# Patient Record
Sex: Female | Born: 1985 | Hispanic: Yes | Marital: Married | Smoking: Never smoker
Health system: Southern US, Community
[De-identification: ages and names within clinical notes are randomized; demographics above are authoritative.]

## PROBLEM LIST (undated history)

## (undated) DIAGNOSIS — Z789 Other specified health status: Secondary | ICD-10-CM

## (undated) DIAGNOSIS — R002 Palpitations: Secondary | ICD-10-CM

---

## 2012-01-26 ENCOUNTER — Emergency Department (HOSPITAL_COMMUNITY)
Admission: EM | Admit: 2012-01-26 | Discharge: 2012-01-27 | Disposition: A | Discharge status: Home / Self Care | Payer: Medicaid Other | Attending: Emergency Medicine | Admitting: Emergency Medicine

## 2012-01-26 ENCOUNTER — Encounter (HOSPITAL_COMMUNITY): Payer: Self-pay

## 2012-01-26 DIAGNOSIS — O99891 Other specified diseases and conditions complicating pregnancy: Secondary | ICD-10-CM | POA: Insufficient documentation

## 2012-01-26 DIAGNOSIS — K029 Dental caries, unspecified: Secondary | ICD-10-CM

## 2012-01-26 MED ORDER — HYDROCODONE-ACETAMINOPHEN 5-500 MG PO TABS: 1.0000 | ORAL_TABLET | Freq: Four times a day (QID) | ORAL | Status: AC | PRN

## 2012-01-26 MED ORDER — PENICILLIN V POTASSIUM 250 MG PO TABS: 250.0000 mg | ORAL_TABLET | Freq: Four times a day (QID) | ORAL | Status: AC

## 2012-01-26 NOTE — ED Provider Notes (Addendum)
History     CSN: [id number]  Arrival date & time 01/26/12  2136   First MD Initiated Contact with Patient 01/26/12 2311      Chief Complaint  Patient presents with  . Dental Pain    (Consider location/radiation/quality/duration/timing/severity/associated sxs/prior treatment) Patient is a 26 y.o. female presenting with tooth pain. The history is provided by the patient and the spouse. The history is limited by a language barrier.  Dental PainThe primary symptoms include mouth pain. Primary symptoms do not include fever. The symptoms began yesterday. The symptoms are worsening. The symptoms are recurrent. The symptoms occur constantly.  Additional symptoms include: dental sensitivity to temperature, gum swelling, gum tenderness, jaw pain, facial swelling and ear pain. Additional symptoms do not include: trouble swallowing, pain with swallowing and drooling. Medical issues include: periodontal disease.    History reviewed. No pertinent past medical history.  History reviewed. No pertinent past surgical history.  History reviewed. No pertinent family history.  History  Substance Use Topics  . Smoking status: Never Smoker   . Smokeless tobacco: Not on file  . Alcohol Use: No    OB History    Grav Para Term Preterm Abortions TAB SAB Ect Mult Living   1               Review of Systems  Constitutional: Negative for fever.  HENT: Positive for ear pain and facial swelling. Negative for drooling and trouble swallowing.   All other systems reviewed and are negative.    Allergies  Review of patient's allergies indicates not on file.  Home Medications  No current outpatient prescriptions on file.  BP 116/57  Pulse 76  Temp 98.1 F (36.7 C) (Oral)  Resp 12  SpO2 97%  LMP 11/06/2011  Physical Exam  Nursing note and vitals reviewed. Constitutional: She is oriented to person, place, and time. She appears well-developed and well-nourished. No distress.  HENT:  Head:  Normocephalic and atraumatic.    Mouth/Throat: Mucous membranes are normal.    Eyes: EOM are normal. Pupils are equal, round, and reactive to light.  Cardiovascular: Normal rate.   Neurological: She is alert and oriented to person, place, and time.  Skin: Skin is warm and dry. No rash noted. No erythema.    ED Course  Dental Date/Time: 01/26/2012 11:28 PM Performed by: Gwyneth Sprout Authorized by: Gwyneth Sprout Consent: Verbal consent obtained. Consent given by: patient Patient understanding: patient states understanding of the procedure being performed Patient identity confirmed: verbally with patient Local anesthesia used: yes Anesthesia: local infiltration Local anesthetic: bupivacaine 0.5% without epinephrine Anesthetic total: 2 ml Patient sedated: no Patient tolerance: Patient tolerated the procedure well with no immediate complications. Comments: Complete resolution of pain   (including critical care time)  Labs Reviewed - No data to display No results found.   1. Dental caries       MDM   Pt with dental caries and facial swelling.  No signs of ludwig's angina or difficulty swallowing and no systemic symptoms. Will treat with PCN and have pt f/u with dentist.  Pt is [redacted]weeks pregnant however does not have any pregnancy related complaints.         Gwyneth Sprout, MD 01/26/12 1191  Gwyneth Sprout, MD 01/26/12 2329

## 2012-01-26 NOTE — ED Notes (Signed)
Patient is alert and oriented x4.  Patient has no complaints of pain.  Discharge instructions were explained to her and the spouse and they had no questions.

## 2012-01-26 NOTE — ED Notes (Addendum)
Pt. Reports dental pain on right side x 24 hrs. Pain radiates to ear and neck on the right side.  History of  Similar pain on same tooth 2 months ago.  Took half a capsule of acetaminophen  at 1130am with no relief. Pt is [redacted] weeks pregnant. First ultrasound is scheduled for this coming Wed.

## 2012-02-10 ENCOUNTER — Other Ambulatory Visit (HOSPITAL_COMMUNITY)
Admission: RE | Admit: 2012-02-10 | Discharge: 2012-02-10 | Disposition: A | Discharge status: Home / Self Care | Payer: Medicaid Other | Source: Ambulatory Visit | Attending: Obstetrics and Gynecology | Admitting: Obstetrics and Gynecology

## 2012-02-10 DIAGNOSIS — Z01419 Encounter for gynecological examination (general) (routine) without abnormal findings: Secondary | ICD-10-CM | POA: Insufficient documentation

## 2012-02-10 DIAGNOSIS — Z113 Encounter for screening for infections with a predominantly sexual mode of transmission: Secondary | ICD-10-CM | POA: Insufficient documentation

## 2012-02-11 ENCOUNTER — Other Ambulatory Visit: Payer: Self-pay | Admitting: Obstetrics and Gynecology

## 2012-02-11 ENCOUNTER — Encounter (HOSPITAL_COMMUNITY): Payer: Self-pay

## 2012-02-11 ENCOUNTER — Encounter (HOSPITAL_COMMUNITY): Payer: Self-pay | Admitting: Pharmacy Technician

## 2012-02-11 ENCOUNTER — Encounter (HOSPITAL_COMMUNITY)
Admission: RE | Admit: 2012-02-11 | Discharge: 2012-02-11 | Disposition: A | Discharge status: Home / Self Care | Payer: Medicaid Other | Source: Ambulatory Visit | Attending: Obstetrics and Gynecology | Admitting: Obstetrics and Gynecology

## 2012-02-11 HISTORY — DX: Other specified health status: Z78.9

## 2012-02-11 LAB — SURGICAL PCR SCREEN
MRSA, PCR: NEGATIVE
Staphylococcus aureus: NEGATIVE

## 2012-02-11 NOTE — H&P (Signed)
Nicole Le is an 26 y.o. female. . She is admitted at this time for suction dilation and curettage due to missed abortion. She has been followed at [facility] ultrasound 01/29/2012 showing fetal pole and fetal heart motion present followup exam 02/10/2012 she had ultrasound confirmation of missed abortion with minimal fetal growth after her initial visit. Crown-rump length was 15.8 mm no fetal cardiac activity. She returned to 1 day later 02/11/2012 complaining cramping and after discussion desires section dilation and curettage. Treatment alternatives including her explanation of Cytotec induction of spontaneous abortion have been discussed with patient and she declines the suction decline blood type is O+ hemoglobin 12.9 hematocrit 37 hepatitis HIV RPR GC and Chlamydia are negative cysts negative screens negative. Past medical history positive for abnormal Pap view for repeat that at this time surgical history negative allergies none gravida 4 para 3003, term pregnancies delivered vaginally x3  Pertinent Gynecological History: Menses: LMP 11/06/2011 placing EDC 08/12/2012. Crown-rump does not correlate and gestational age based on early ultrasound suggested [facility] 09/10/2012 Bleeding: None at present Contraception: Undecided DES exposure: unknown Blood transfusions: none Sexually transmitted diseases: no past history Previous GYN Procedures: None  Last mammogram: Applicable Date:  Last pap:  Date:  OB History: G4, P3003   Menstrual History: Menarche age:  Patient's last menstrual period was 11/06/2011.    Past Medical History  Diagnosis Date  . No pertinent past medical history     Past Surgical History  Procedure Date  . No past surgeries     No family history on file.  Social History:  reports that she has never smoked. She does not have any smokeless tobacco history on file. She reports that she does not drink alcohol or use illicit drugs.  Allergies: No  Known Allergies  No prescriptions prior to admission    ROS  Last menstrual period 11/06/2011. Physical Exam Physical Examination: General appearance - alert, well appearing, and in no distress and she understands [place], but prefers to use her husband as Nurse, learning disability Mental status - alert, oriented to person, place, and time Chest - clear to auscultation, no wheezes, rales or rhonchi, symmetric air entry Heart - normal rate and regular rhythm Abdomen - soft, nontender, nondistended, no masses or organomegaly Pelvic - normal external genitalia, vulva, vagina, cervix, uterus and adnexa, UTERUS: uterus is normal size, shape, consistency and nontender, enlarged to 8 weeks week's size, ultrasound confirms missed abortion   Results for orders placed during the hospital encounter of 02/11/12 (from the past 24 hour(s))  SURGICAL PCR SCREEN     Status: Normal   Collection Time   02/11/12  1:00 PM      Component Value Range   MRSA, PCR NEGATIVE  NEGATIVE   Staphylococcus aureus NEGATIVE  NEGATIVE    No results found.  Assessment/Plan: Missed abortion [redacted] weeks gestation for suction dilation and curettage 7:30 AM on Thursday, 02/13/2012  Nicole Le V 02/11/2012, 8:39 PM

## 2012-02-11 NOTE — Patient Instructions (Addendum)
Your procedure is scheduled on:  02/13/2012  Report to [facility] at 6:15      AM.  Call this number if you have problems the morning of surgery: [phone]   Remember:   Do not drink or eat food:After Midnight.         Do not wear jewelry, make-up or nail polish.  Do not wear lotions, powders, or perfumes. You may wear deodorant.  Do not shave 48 hours prior to surgery.  Do not bring valuables to the hospital.  Contacts, dentures or bridgework may not be worn into surgery.  Leave suitcase in the car. After surgery it may be brought to your room.  For patients admitted to the hospital, checkout time is 11:00 AM the day of discharge.   Patients discharged the day of surgery will not be allowed to drive home.  Name and phone number of your driver:   Special Instructions: CHG Shower use special wash: 1/2 bottle night before surgery and 1/2 bottle morning of surgery.   Please read over the following fact sheets that you were given: Pain Booklet, MRSA  Surgical Site Infection Prevention, Anesthesia Post-op Instructions and Care and Recovery After Surgery    Dilatacin y curetaje o curetaje por aspiracin  (Dilation and Curettage or Vacuum Curettage) La dilatacin y curetaje y el curetaje por aspiracin son procedimientos menores. Consiste en la apertura (dilatacin) del cuello del tero y el raspado (curetaje) en la superficie interna del tero. Durante este procedimiento, el tejido del interior del tero se raspa suavemente. Durante el curetaje por aspiracin, se utiliza una succin suave para extirpar tejidos del tero.  Puede indicarse para realizar un diagnstico o tener un propsito teraputico. Como procedimiento diagnstico, se realiza para examinar los tejidos del tero. El examen del tejido puede determinar las causas o las opciones de tratamiento para diversos sntomas. Un diagnstico con curetaje se realiza en caso de presentarse los siguientes sntomas:   Sangrado irregular del  tero.   Hemorragias con cogulos.   Pierde sangre entre los perodos [employer].   Perodos menstruales prolongados.   Sangrado luego de la menopausia.   Falta de periodo menstrual (amenorrea).   Cambio en el tamao y forma del tero.  Un curetaje teraputico se realiza para extirpar tejidos, sangre, o un dispositivo anticonceptivo. El curetaje teraputico se realiza en caso de presentarse las siguientes enfermedades:   Remocin del DIU (dispositivo intrauterino).   Remocin de placenta retenida luego del parto . La placenta retenida puede causar una hemorragia lo suficientemente grave como para requerir transfusiones o Chiropractor una infeccin.   Cesacin del embarazo.   Aborto espontneo.   Extirpacin de plipos en el interior del tero.   Extirpacin de fibromas no frecuentes (bultos no cancerosos).  HGALE SABER A SU MDICO SOBRE:  Alergias a alimentos o medicamentos.   Medicamentos que [place], incluyendo vitaminas, hierbas, gotas oftlmicas, medicamentos de venta libre y cremas.   Uso de corticoides (por va oral o cremas).   Problemas anteriores debido a anestsicos o a medicamentos que [employer] sensibilidad.   Antecedentes de hemorragias o cogulos sanguneos.   Cirugas anteriores.   Otros problemas de salud, incluyendo diabetes y problemas renales.   Posibilidad de embarazo, si corresponde.  RIESGOS Y COMPLICACIONES  Hemorragia excesiva.   Infeccin en el tero.   Lesin en el cuello del tero.   Desarrollo de tejido Designer, television/film set (adherencias) dentro del tero que posteriormente causan hemorragia menstrual en cantidad anormal.   Complicaciones de la anestesia  general, si se ha usado.   Perforacin del tero. Pero es poco comn.  ANTES DEL PROCEDIMIENTO  Coma y beba antes del procedimiento slo lo que le indique el mdico.   Pdale a alguna persona que la lleve a su casa.  PROCEDIMIENTO  Este procedimiento puede ser [employer] un  hospital, como paciente ambulatorio o en el consultorio del mdico.   Podrn administrarle una anestesia general o local, alrededor del cuello del tero.   Deber recostarse sobre la espalda con las piernas en los estribos.   [employer] formas en las que el cuello del tero puede ser ablandado dilatado. Ellas son:   Tomando medicamentos.   Insertado unas varillas delgadas (laminarias) en el cuello del tero.   Con un instrumento curvo (cureta) rasparn las clulas de la superficie interna del tero, las que luego sern retiradas.  El procedimiento demora entre 15 y 30 minutos.  DESPUS DEL PROCEDIMIENTO  Descansar en una sala de recuperacin hasta que se sienta estable y lista para volver a su casa.   Tendr que solicitarle a alguna persona que la lleve a su casa.   Podr tener nuseas o vmitos si le han administrado anestesia general.   Financial risk analyst de garganta si le han colocado un tubo durante la anestesia general.   Podr sentir clicos leves tener una pequea hemorragia entre 2 das y 2 semanas despus del procedimiento.   Luego del procedimiento, el tero formar [place]. Esto podr retrasar su perodo menstrual.  Document Released: 11/27/2007 Document Revised: 05/30/2011 [facility] Patient Information 2012 [place], [place].

## 2012-02-13 ENCOUNTER — Encounter (HOSPITAL_COMMUNITY)
Admission: RE | Disposition: A | Discharge status: Home / Self Care | Payer: Self-pay | Source: Ambulatory Visit | Attending: Obstetrics and Gynecology

## 2012-02-13 ENCOUNTER — Encounter (HOSPITAL_COMMUNITY): Payer: Self-pay | Admitting: Anesthesiology

## 2012-02-13 ENCOUNTER — Ambulatory Visit (HOSPITAL_COMMUNITY)
Admission: RE | Admit: 2012-02-13 | Discharge: 2012-02-13 | Disposition: A | Discharge status: Home / Self Care | Payer: Medicaid Other | Source: Ambulatory Visit | Attending: Obstetrics and Gynecology | Admitting: Obstetrics and Gynecology

## 2012-02-13 ENCOUNTER — Ambulatory Visit (HOSPITAL_COMMUNITY): Payer: Medicaid Other | Admitting: Anesthesiology

## 2012-02-13 ENCOUNTER — Encounter (HOSPITAL_COMMUNITY): Payer: Self-pay | Admitting: *Deleted

## 2012-02-13 DIAGNOSIS — O021 Missed abortion: Secondary | ICD-10-CM | POA: Diagnosis present

## 2012-02-13 HISTORY — PX: DILATION AND CURETTAGE OF UTERUS: SHX78

## 2012-02-13 SURGERY — DILATION AND CURETTAGE
Anesthesia: General | Site: Vagina | Wound class: Clean Contaminated

## 2012-02-13 MED ORDER — PROPOFOL 10 MG/ML IV BOLUS
INTRAVENOUS | Status: DC | PRN
  Administered 2012-02-13: 130 mg via INTRAVENOUS

## 2012-02-13 MED ORDER — FENTANYL CITRATE 0.05 MG/ML IJ SOLN: 25.0000 ug | INTRAMUSCULAR | Status: DC | PRN

## 2012-02-13 MED ORDER — PROPOFOL 10 MG/ML IV EMUL
INTRAVENOUS | Status: AC
  Filled 2012-02-13: qty 20

## 2012-02-13 MED ORDER — FENTANYL CITRATE 0.05 MG/ML IJ SOLN
INTRAMUSCULAR | Status: DC | PRN
  Administered 2012-02-13 (×2): 50 ug via INTRAVENOUS

## 2012-02-13 MED ORDER — LIDOCAINE HCL 1 % IJ SOLN
INTRAMUSCULAR | Status: DC | PRN
  Administered 2012-02-13: 50 mg via INTRADERMAL

## 2012-02-13 MED ORDER — MIDAZOLAM HCL 2 MG/2ML IJ SOLN
1.0000 mg | INTRAMUSCULAR | Status: DC | PRN
  Administered 2012-02-13: 2 mg via INTRAVENOUS

## 2012-02-13 MED ORDER — CEFAZOLIN SODIUM-DEXTROSE 2-3 GM-% IV SOLR
INTRAVENOUS | Status: AC
  Filled 2012-02-13: qty 50

## 2012-02-13 MED ORDER — CEFAZOLIN SODIUM-DEXTROSE 2-3 GM-% IV SOLR: 2.0000 g | Freq: Once | INTRAVENOUS | Status: DC

## 2012-02-13 MED ORDER — ONDANSETRON HCL 4 MG/2ML IJ SOLN
4.0000 mg | Freq: Once | INTRAMUSCULAR | Status: AC
  Administered 2012-02-13: 4 mg via INTRAVENOUS

## 2012-02-13 MED ORDER — CEFAZOLIN SODIUM-DEXTROSE 2-3 GM-% IV SOLR
INTRAVENOUS | Status: DC | PRN
  Administered 2012-02-13: 2 g via INTRAVENOUS

## 2012-02-13 MED ORDER — ONDANSETRON HCL 4 MG/2ML IJ SOLN
INTRAMUSCULAR | Status: AC
  Filled 2012-02-13: qty 2

## 2012-02-13 MED ORDER — MIDAZOLAM HCL 2 MG/2ML IJ SOLN
INTRAMUSCULAR | Status: AC
  Filled 2012-02-13: qty 2

## 2012-02-13 MED ORDER — LACTATED RINGERS IV SOLN
INTRAVENOUS | Status: DC
  Administered 2012-02-13: 1000 mL via INTRAVENOUS

## 2012-02-13 MED ORDER — FENTANYL CITRATE 0.05 MG/ML IJ SOLN
INTRAMUSCULAR | Status: AC
  Filled 2012-02-13: qty 2

## 2012-02-13 MED ORDER — IBUPROFEN 200 MG PO TABS: 600.0000 mg | ORAL_TABLET | Freq: Four times a day (QID) | ORAL | Status: AC | PRN

## 2012-02-13 MED ORDER — BUPIVACAINE HCL (PF) 0.5 % IJ SOLN
INTRAMUSCULAR | Status: AC
  Filled 2012-02-13: qty 30

## 2012-02-13 MED ORDER — LIDOCAINE HCL (PF) 1 % IJ SOLN
INTRAMUSCULAR | Status: AC
  Filled 2012-02-13: qty 5

## 2012-02-13 MED ORDER — BUPIVACAINE HCL (PF) 0.5 % IJ SOLN
INTRAMUSCULAR | Status: DC | PRN
  Administered 2012-02-13: 20 mL

## 2012-02-13 MED ORDER — 0.9 % SODIUM CHLORIDE (POUR BTL) OPTIME
TOPICAL | Status: DC | PRN
  Administered 2012-02-13: 1000 mL

## 2012-02-13 MED ORDER — LACTATED RINGERS IV SOLN: INTRAVENOUS | Status: DC

## 2012-02-13 MED ORDER — MIDAZOLAM HCL 5 MG/5ML IJ SOLN
INTRAMUSCULAR | Status: DC | PRN
  Administered 2012-02-13: 2 mg via INTRAVENOUS

## 2012-02-13 MED ORDER — ONDANSETRON HCL 4 MG/2ML IJ SOLN: 4.0000 mg | Freq: Once | INTRAMUSCULAR | Status: DC | PRN

## 2012-02-13 SURGICAL SUPPLY — 35 items
BAG HAMPER (MISCELLANEOUS) ×2 IMPLANT
CATH ROBINSON RED A/P 14FR (CATHETERS) IMPLANT
CLOTH BEACON ORANGE TIMEOUT ST (SAFETY) ×2 IMPLANT
COVER LIGHT HANDLE STERIS (MISCELLANEOUS) ×4 IMPLANT
CURETTE VACUUM 9MM CVD CLR (CANNULA) ×2 IMPLANT
DECANTER SPIKE VIAL GLASS SM (MISCELLANEOUS) ×2 IMPLANT
FORMALIN 10 PREFIL 480ML (MISCELLANEOUS) ×2 IMPLANT
GAUZE SPONGE 4X4 16PLY XRAY LF (GAUZE/BANDAGES/DRESSINGS) ×2 IMPLANT
GLOVE BIOGEL PI IND STRL 7.0 (GLOVE) ×1 IMPLANT
GLOVE BIOGEL PI INDICATOR 7.0 (GLOVE) ×1
GLOVE ECLIPSE 9.0 STRL (GLOVE) ×2 IMPLANT
GLOVE EXAM NITRILE MD LF STRL (GLOVE) ×2 IMPLANT
GLOVE INDICATOR STER SZ 9 (GLOVE) ×2 IMPLANT
GLOVE SS BIOGEL STRL SZ 6.5 (GLOVE) ×1 IMPLANT
GLOVE SS BIOGEL STRL SZ 7.5 (GLOVE) ×1 IMPLANT
GLOVE SUPERSENSE BIOGEL SZ 6.5 (GLOVE) ×1
GLOVE SUPERSENSE BIOGEL SZ 7.5 (GLOVE) ×1
GOWN STRL REIN 3XL LVL4 (GOWN DISPOSABLE) ×2 IMPLANT
GOWN STRL REIN XL XLG (GOWN DISPOSABLE) ×4 IMPLANT
KIT BERKELEY 1ST TRIMESTER 3/8 (MISCELLANEOUS) ×2 IMPLANT
KIT ROOM TURNOVER AP CYSTO (KITS) ×2 IMPLANT
MARKER SKIN DUAL TIP RULER LAB (MISCELLANEOUS) ×2 IMPLANT
NEEDLE HYPO 18GX1.5 BLUNT FILL (NEEDLE) ×2 IMPLANT
NEEDLE SPNL 22GX3.5 QUINCKE BK (NEEDLE) ×2 IMPLANT
NS IRRIG 1000ML POUR BTL (IV SOLUTION) ×2 IMPLANT
PACK BASIC III (CUSTOM PROCEDURE TRAY) ×1
PACK SRG BSC III STRL LF ECLPS (CUSTOM PROCEDURE TRAY) ×1 IMPLANT
PAD ARMBOARD 7.5X6 YLW CONV (MISCELLANEOUS) ×2 IMPLANT
SET BERKELEY SUCTION TUBING (SUCTIONS) ×2 IMPLANT
SYR CONTROL 10ML LL (SYRINGE) ×2 IMPLANT
TOWEL OR 17X26 4PK STRL BLUE (TOWEL DISPOSABLE) ×2 IMPLANT
VACURETTE 10MM (CANNULA) IMPLANT
VACURETTE 12MM (CANNULA) IMPLANT
VACURETTE 14MM (CANNULA) IMPLANT
VACURETTE 8MM (CANNULA) IMPLANT

## 2012-02-13 NOTE — Transfer of Care (Signed)
Immediate Anesthesia Transfer of Care Note  Patient: Nicole Le  Procedure(s) Performed: Procedure(s) (LRB): DILATATION AND CURETTAGE (N/A)  Patient Location: PACU  Anesthesia Type: General  Level of Consciousness: awake and patient cooperative  Airway & Oxygen Therapy: Patient Spontanous Breathing and Patient connected to face mask oxygen  Post-op Assessment: Report given to PACU RN, Post -op Vital signs reviewed and stable and Patient moving all extremities  Post vital signs: Reviewed and stable  Complications: No apparent anesthesia complications

## 2012-02-13 NOTE — Anesthesia Postprocedure Evaluation (Signed)
  Anesthesia Post-op Note  Patient: Nicole Le  Procedure(s) Performed: Procedure(s) (LRB): DILATATION AND CURETTAGE (N/A)  Patient Location: PACU  Anesthesia Type: General  Level of Consciousness: awake, alert , oriented and patient cooperative  Airway and Oxygen Therapy: Patient Spontanous Breathing  Post-op Pain: none  Post-op Assessment: Post-op Vital signs reviewed, Patient's Cardiovascular Status Stable, Respiratory Function Stable, Patent Airway, No signs of Nausea or vomiting and Pain level controlled  Post-op Vital Signs: Reviewed and stable  Complications: No apparent anesthesia complications

## 2012-02-13 NOTE — Op Note (Signed)
See operative details included in the brief operative note 

## 2012-02-13 NOTE — Interval H&P Note (Signed)
History and Physical Interval Note:  02/13/2012 7:42 AM  Nicole Le  has presented today for surgery, with the diagnosis of missed abortion  The various methods of treatment have been discussed with the patient and family. After consideration of risks, benefits and other options for treatment, the patient has consented to  Procedure(s) (LRB): DILATATION AND CURETTAGE (N/A) as a surgical intervention .  The patient's history has been reviewed, patient examined, no change in status, stable for surgery.  I have reviewed the patient's chart and labs.  Questions were answered to the patient's satisfaction.     Tilda Burrow

## 2012-02-13 NOTE — Brief Op Note (Signed)
02/13/2012  8:27 AM  PATIENT:  Nicole Le  26 y.o. female  PRE-OPERATIVE DIAGNOSIS:  missed abortion  POST-OPERATIVE DIAGNOSIS:  missed abortion  PROCEDURE:  Procedure(s) (LRB): DILATATION AND CURETTAGE (N/A)  SURGEON:  Surgeon(s) and Role:    * Tilda Burrow, MD - Primary  PHYSICIAN ASSISTANT:   ASSISTANTS: none    ANESTHESIA:   local and general  EBL:  Total I/O In: 200 [I.V.:200] Out: -   BLOOD ADMINISTERED:none  DRAINS: none   LOCAL MEDICATIONS USED:  MARCAINE    and Amount: 20 ml  SPECIMEN:  Source of Specimen:  product of conception  DISPOSITION OF SPECIMEN:  PATHOLOGY  COUNTS:  YES  TOURNIQUET:  * No tourniquets in log *  DICTATION: .Dragon Dictation Patient was taken operating room where general anesthesia with LMA airway control.. Perineum was prepped and draped timeout conducted and Ancef administered. Speculum was inserted, cervix grasped with single-tooth tenaculum paracervical block applied using 20 cc of Marcaine with epinephrine. Uterus is sounded to 12 cm in the anteflexed position dilated to 31 [place] allowing introduction of curved 9 mm suction curette and circumferential curettage resulted in satisfactory removal of tissue products and fluid consistent with a missed AB. Smooth sharp curettage confirmed satisfactory uterine evacuation. There was no suspicion of complications or perforation. Minimal bleeding, and patient went to recovery in stable condition blood type confirmed as Rh+ from records in the office.  PLAN OF CARE: Discharge to home after PACU  PATIENT DISPOSITION:  PACU - hemodynamically stable.   Delay start of Pharmacological VTE agent (>24hrs) due to surgical blood loss or risk of bleeding: not applicable

## 2012-02-13 NOTE — Anesthesia Preprocedure Evaluation (Signed)
Anesthesia Evaluation  Patient identified by MRN, date of birth, ID band Patient awake    Reviewed: Allergy & Precautions, H&P , NPO status , Patient's Chart, lab work & pertinent test results  Airway Mallampati: II TM Distance: >3 FB Neck ROM: Full    Dental  (+) Teeth Intact   Pulmonary neg pulmonary ROS,  breath sounds clear to auscultation        Cardiovascular negative cardio ROS  Rhythm:Regular Rate:Normal     Neuro/Psych    GI/Hepatic negative GI ROS,   Endo/Other    Renal/GU      Musculoskeletal   Abdominal   Peds  Hematology   Anesthesia Other Findings   Reproductive/Obstetrics                           Anesthesia Physical Anesthesia Plan  ASA: I  Anesthesia Plan: General   Post-op Pain Management:    Induction: Intravenous  Airway Management Planned: LMA  Additional Equipment:   Intra-op Plan:   Post-operative Plan: Extubation in OR  Informed Consent: I have reviewed the patients History and Physical, chart, labs and discussed the procedure including the risks, benefits and alternatives for the proposed anesthesia with the patient or authorized representative who has indicated his/her understanding and acceptance.     Plan Discussed with:   Anesthesia Plan Comments:         Anesthesia Quick Evaluation  

## 2012-02-13 NOTE — Anesthesia Postprocedure Evaluation (Signed)
  Anesthesia Post-op Note  Patient: Makara Giusto  Procedure(s) Performed: Procedure(s) (LRB): DILATATION AND CURETTAGE (N/A)  Patient Location: PACU  Anesthesia Type: General  Level of Consciousness: awake and patient cooperative  Airway and Oxygen Therapy: Patient Spontanous Breathing and Patient connected to face mask oxygen  Post-op Pain: none  Post-op Assessment: Post-op Vital signs reviewed, Patient's Cardiovascular Status Stable, Respiratory Function Stable, Patent Airway, No signs of Nausea or vomiting and Pain level controlled  Post-op Vital Signs: Reviewed and stable  Complications: No apparent anesthesia complications

## 2012-02-13 NOTE — Anesthesia Procedure Notes (Signed)
Procedure Name: LMA Insertion Date/Time: 02/13/2012 8:00 AM Performed by: Despina Hidden Pre-anesthesia Checklist: Patient identified, Emergency Drugs available, Suction available and Patient being monitored Patient Re-evaluated:Patient Re-evaluated prior to inductionOxygen Delivery Method: Circle system utilized Preoxygenation: Pre-oxygenation with 100% oxygen Intubation Type: IV induction Ventilation: Mask ventilation without difficulty LMA Size: 3.0 Number of attempts: 1 Placement Confirmation: ETT inserted through vocal cords under direct vision,  positive ETCO2 and breath sounds checked- equal and bilateral Tube secured with: Tape Dental Injury: Teeth and Oropharynx as per pre-operative assessment

## 2012-02-17 ENCOUNTER — Encounter (HOSPITAL_COMMUNITY): Payer: Self-pay | Admitting: Obstetrics and Gynecology

## 2013-03-19 ENCOUNTER — Emergency Department (HOSPITAL_COMMUNITY): Payer: Medicaid Other

## 2013-03-19 ENCOUNTER — Emergency Department (HOSPITAL_COMMUNITY)
Admission: EM | Admit: 2013-03-19 | Discharge: 2013-03-19 | Disposition: A | Discharge status: Home / Self Care | Payer: Medicaid Other | Attending: Emergency Medicine | Admitting: Emergency Medicine

## 2013-03-19 ENCOUNTER — Encounter (HOSPITAL_COMMUNITY): Payer: Self-pay

## 2013-03-19 DIAGNOSIS — Z79899 Other long term (current) drug therapy: Secondary | ICD-10-CM | POA: Insufficient documentation

## 2013-03-19 DIAGNOSIS — R42 Dizziness and giddiness: Secondary | ICD-10-CM | POA: Insufficient documentation

## 2013-03-19 DIAGNOSIS — R0789 Other chest pain: Secondary | ICD-10-CM | POA: Insufficient documentation

## 2013-03-19 DIAGNOSIS — R0602 Shortness of breath: Secondary | ICD-10-CM | POA: Insufficient documentation

## 2013-03-19 DIAGNOSIS — Z3202 Encounter for pregnancy test, result negative: Secondary | ICD-10-CM | POA: Insufficient documentation

## 2013-03-19 LAB — URINALYSIS, ROUTINE W REFLEX MICROSCOPIC
Hgb urine dipstick: NEGATIVE
Leukocytes, UA: NEGATIVE
Nitrite: NEGATIVE
Specific Gravity, Urine: 1.01 (ref 1.005–1.030)
Urobilinogen, UA: 0.2 mg/dL (ref 0.0–1.0)

## 2013-03-19 LAB — COMPREHENSIVE METABOLIC PANEL
AST: 19 U/L (ref 0–37)
Albumin: 4.2 g/dL (ref 3.5–5.2)
Chloride: 102 mEq/L (ref 96–112)
Creatinine, Ser: 0.56 mg/dL (ref 0.50–1.10)
Total Bilirubin: 0.3 mg/dL (ref 0.3–1.2)
Total Protein: 7.7 g/dL (ref 6.0–8.3)

## 2013-03-19 LAB — CBC WITH DIFFERENTIAL/PLATELET
Basophils Absolute: 0 10*3/uL (ref 0.0–0.1)
Basophils Relative: 0 % (ref 0–1)
Eosinophils Absolute: 0 10*3/uL (ref 0.0–0.7)
MCHC: 34.2 g/dL (ref 30.0–36.0)
Monocytes Absolute: 0.5 10*3/uL (ref 0.1–1.0)
Neutro Abs: 5.4 10*3/uL (ref 1.7–7.7)
Neutrophils Relative %: 68 % (ref 43–77)
RDW: 12.5 % (ref 11.5–15.5)

## 2013-03-19 LAB — PREGNANCY, URINE: Preg Test, Ur: NEGATIVE

## 2013-03-19 LAB — D-DIMER, QUANTITATIVE: D-Dimer, Quant: <0.27 ug/mL-FEU (ref 0.00–0.48)

## 2013-03-19 MED ORDER — IBUPROFEN 800 MG PO TABS: 800.0000 mg | ORAL_TABLET | Freq: Three times a day (TID) | ORAL | Status: DC

## 2013-03-19 NOTE — ED Notes (Signed)
Pt reports cp and sob for 10 min pta, started after drinking a coke, no nausea or vomiting, no diarrhea or fever. Denies any cough or congestion. Had same episode 2 weeks ago and all symptoms went away

## 2013-03-19 NOTE — ED Provider Notes (Signed)
CSN: [id number]     Arrival date & time 03/19/13  1749 History  This chart was scribed for Glynn Octave, MD by Bennett Scrape, ED Scribe. This patient was seen in room APA06/APA06 and the patient's care was started at [facility] PM.   Chief Complaint  Patient presents with  . Chest Pain  . Shortness of Breath    The history is provided by the patient. A language interpreter was used Psychologist, prison and probation services Interpretators: [place], [id number]).   HPI Comments: Nicole Le is a 27 y.o. female who presents to the Emergency Department complaining of intermittent right-sided CP with associated SOB and dizziness for the past 2 weeks. She states that the pain is sharp, lasts approximately 30 minutes at a time and usually resolves on its own. The pain has been located on the right side with every episode and does not radiate. Most recent episode started 10 minutes PTA while sleeping and improved after drinking a soda. She states that "confinement" triggers the symptoms and that going outside to get "fresh air" improves her symptoms. She denies having a h/o anxiety. She denies having pain currently. She denies nausea, emesis, cough, abdominal pain, back pain, congestion, diarrhea and fevers as associated symptoms. LNMP was September 1st, 2014.  Pt denies having a PCP currently  Past Medical History  Diagnosis Date  . No pertinent past medical history    Past Surgical History  Procedure Laterality Date  . No past surgeries    . Dilation and curettage of uterus  02/13/2012    Procedure: DILATATION AND CURETTAGE;  Surgeon: Tilda Burrow, MD;  Location: [facility];  Service: Gynecology;  Laterality: N/A;  Suction    No family history on file. History  Substance Use Topics  . Smoking status: Never Smoker   . Smokeless tobacco: Not on file  . Alcohol Use: No   OB History   Grav Para Term Preterm Abortions TAB SAB Ect Mult Living   1              Review of Systems  A complete 10 system review of systems  was obtained and all systems are negative except as noted in the HPI and PMH.   Allergies  Review of patient's allergies indicates no known allergies.  Home Medications   Current Outpatient Rx  Name  Route  Sig  Dispense  Refill  . ibuprofen (ADVIL,MOTRIN) 800 MG tablet   Oral   Take 1 tablet (800 mg total) by mouth 3 (three) times daily.   21 tablet   0     Triage Vitals: BP 130/62  Pulse 76  Temp(Src) 97.7 F (36.5 C) (Oral)  Resp 20  Ht 5\' 2"  (1.575 m)  Wt 180 lb (81.647 kg)  BMI 32.91 kg/m2  SpO2 100%  LMP 02/22/2013  Breastfeeding? Unknown  Physical Exam  Nursing note and vitals reviewed. Constitutional: She is oriented to person, place, and time. She appears well-developed and well-nourished. No distress.  HENT:  Head: Normocephalic and atraumatic.  Eyes: Conjunctivae and EOM are normal.  Neck: Normal range of motion. Neck supple. No tracheal deviation present.  Cardiovascular: Normal rate, regular rhythm and normal heart sounds.   No murmur heard. Pulmonary/Chest: Effort normal and breath sounds normal. No respiratory distress. She has no wheezes. She has no rales. She exhibits tenderness (reproducible right-sided chest wall tenderness).  Abdominal: Soft. Bowel sounds are normal. There is no tenderness.  Musculoskeletal: Normal range of motion. She exhibits no edema (no calf swelling) and  no tenderness (no calf tenderness).  Neurological: She is alert and oriented to person, place, and time. No cranial nerve deficit.  Skin: Skin is warm and dry.  Psychiatric: She has a normal mood and affect. Her behavior is normal.    ED Course  Procedures (including critical care time)  DIAGNOSTIC STUDIES: Oxygen Saturation is 100% on room air, normal by my interpretation.    COORDINATION OF CARE: 6:20 PM-Advised pt that her symptoms are not cardiac related, most likely chest wall or anxiety related instead. Discussed treatment plan which includes CXR, CBC panel, CMP and  d-dimer with pt at bedside and pt agreed to plan. Pt is requesting for cholesterol check. Advised pt that she will need to have that checked by her PCP.  Labs Review Labs Reviewed  COMPREHENSIVE METABOLIC PANEL - Abnormal; Notable for the following:    Glucose, Bld 131 (*)    All other components within normal limits  CBC WITH DIFFERENTIAL  TROPONIN I  D-DIMER, QUANTITATIVE  PREGNANCY, URINE  URINALYSIS, ROUTINE W REFLEX MICROSCOPIC   Imaging Review Dg Chest 2 View  03/19/2013   CLINICAL DATA:  Chest pain and shortness of breath.  Fever.  EXAM: CHEST  2 VIEW  COMPARISON:  None.  FINDINGS: The heart size and mediastinal contours are within normal limits. Both lungs are clear. The visualized skeletal structures are unremarkable.  IMPRESSION: No active cardiopulmonary disease.   Electronically Signed   By: Amie Portland   On: 03/19/2013 19:50    MDM   1. Atypical chest pain    Intermittent episodes of chest pain and shortness of breath associated with anxious feeling. Episodes lasting for 10-30 minutes at a time improve with drinking Coke and going outside.  Symptoms atypical for ACS or PE. EKG nonischemic.  Chest x-ray negative. D-dimer and troponin negative. No recurrent chest pain or shortness of breath in the ED. Low suspicion for ACS or PE. We'll treat conservatively for muscle skeletal chest pain with possible component. Return precautions discussed.   Date: 03/19/2013  Rate: 83  Rhythm: normal sinus rhythm  QRS Axis: normal  Intervals: normal  ST/T Wave abnormalities: normal  Conduction Disutrbances:right bundle branch block  Narrative Interpretation:   Old EKG Reviewed: none available    I personally performed the services described in this documentation, which was scribed in my presence. The recorded information has been reviewed and is accurate.    Glynn Octave, MD 03/19/13 2023

## 2014-04-25 ENCOUNTER — Encounter (HOSPITAL_COMMUNITY): Payer: Self-pay

## 2014-05-03 ENCOUNTER — Other Ambulatory Visit (HOSPITAL_COMMUNITY)
Admission: RE | Admit: 2014-05-03 | Discharge: 2014-05-03 | Disposition: A | Discharge status: Home / Self Care | Payer: Medicaid Other | Source: Ambulatory Visit | Attending: Unknown Physician Specialty | Admitting: Unknown Physician Specialty

## 2014-05-03 DIAGNOSIS — R87610 Atypical squamous cells of undetermined significance on cytologic smear of cervix (ASC-US): Secondary | ICD-10-CM | POA: Insufficient documentation

## 2014-05-03 DIAGNOSIS — R87810 Cervical high risk human papillomavirus (HPV) DNA test positive: Secondary | ICD-10-CM | POA: Insufficient documentation

## 2014-05-10 LAB — CYTOLOGY - PAP

## 2016-08-06 ENCOUNTER — Other Ambulatory Visit (HOSPITAL_COMMUNITY)
Admission: RE | Admit: 2016-08-06 | Discharge: 2016-08-06 | Disposition: A | Discharge status: Home / Self Care | Payer: Medicaid Other | Source: Ambulatory Visit | Attending: Unknown Physician Specialty | Admitting: Unknown Physician Specialty

## 2016-08-06 DIAGNOSIS — N870 Mild cervical dysplasia: Secondary | ICD-10-CM | POA: Insufficient documentation

## 2016-08-06 DIAGNOSIS — Z9889 Other specified postprocedural states: Secondary | ICD-10-CM | POA: Insufficient documentation

## 2017-06-24 NOTE — L&D Delivery Note (Addendum)
Delivery Note At 12:14 PM, on 06/11/2018, a viable female "Daymon Larsen" was delivered via Vaginal, Spontaneous (Presentation: LOA with manual restitution to LOT ). After delivery of the head a shoulder dystocia was noted. McRober[place] and Suprapubic pressure was utilized with relief of the shoulder resulting in delivery of body ~30 seconds after delivery of head. Infant with good tone, but mild grimace that was immediately improved with tactile stimulation by provider.  Infant placed on mother's abdomen where nurse continued tactile stimulation. APGAR: 9, 9.  Cord clamped, cut, and blood collected. Placenta delivered spontaneously and noted to be intact with 3VC upon inspection. Vaginal inspection revealed a 2nd degree perineal laceration that was repaired with 3-0 vicryl on CT-1 And SH. ~62mL of lidocaine was utilized for local anesthetic and patient tolerated the procedure well. Fundus firm, at the umbilicus, and bleeding small.  Mother hemodynamically stable and infant skin to skin, with father, prior to provider exit.  Mother unsure of method for birth control and opts to breastfeed.  Infant weight at one hour of life: 7lbs 14.4oz    Anesthesia:  Lidocaine Episiotomy: None Lacerations: 2nd degree with repair Suture Repair: 3.0 vicryl Est. Blood Loss (mL): 47  Mom to postpartum.  Baby to Couplet care / Skin to Skin.  Maryann Conners MSN, CNM 06/11/2018, 12:55 PM

## 2017-10-29 ENCOUNTER — Other Ambulatory Visit: Payer: Self-pay | Admitting: Obstetrics & Gynecology

## 2017-10-29 DIAGNOSIS — O3680X0 Pregnancy with inconclusive fetal viability, not applicable or unspecified: Secondary | ICD-10-CM

## 2017-10-30 ENCOUNTER — Ambulatory Visit (INDEPENDENT_AMBULATORY_CARE_PROVIDER_SITE_OTHER): Payer: Self-pay

## 2017-10-30 ENCOUNTER — Encounter (INDEPENDENT_AMBULATORY_CARE_PROVIDER_SITE_OTHER): Payer: Self-pay

## 2017-10-30 DIAGNOSIS — O3680X0 Pregnancy with inconclusive fetal viability, not applicable or unspecified: Secondary | ICD-10-CM

## 2017-10-30 DIAGNOSIS — Z3A01 Less than 8 weeks gestation of pregnancy: Secondary | ICD-10-CM

## 2017-10-30 NOTE — Progress Notes (Signed)
[place] 6+1 wks,single IUP w/ys,positive fht 137 bpm,normal ovaries bilat,crl 4.18 mm

## 2017-11-21 ENCOUNTER — Encounter: Payer: Self-pay | Admitting: Women's Health

## 2017-11-21 ENCOUNTER — Ambulatory Visit: Payer: Self-pay | Admitting: *Deleted

## 2017-11-24 ENCOUNTER — Encounter: Payer: Self-pay | Admitting: Women's Health

## 2017-11-24 ENCOUNTER — Ambulatory Visit: Payer: Self-pay | Admitting: *Deleted

## 2017-11-24 ENCOUNTER — Ambulatory Visit (INDEPENDENT_AMBULATORY_CARE_PROVIDER_SITE_OTHER): Payer: Self-pay | Admitting: Women's Health

## 2017-11-24 VITALS — BP 110/72 | HR 80 | Wt 179.0 lb

## 2017-11-24 DIAGNOSIS — Z331 Pregnant state, incidental: Secondary | ICD-10-CM

## 2017-11-24 DIAGNOSIS — K5900 Constipation, unspecified: Secondary | ICD-10-CM

## 2017-11-24 DIAGNOSIS — Z1389 Encounter for screening for other disorder: Secondary | ICD-10-CM

## 2017-11-24 DIAGNOSIS — O9989 Other specified diseases and conditions complicating pregnancy, childbirth and the puerperium: Secondary | ICD-10-CM

## 2017-11-24 DIAGNOSIS — Z3682 Encounter for antenatal screening for nuchal translucency: Secondary | ICD-10-CM

## 2017-11-24 DIAGNOSIS — Z3490 Encounter for supervision of normal pregnancy, unspecified, unspecified trimester: Secondary | ICD-10-CM | POA: Insufficient documentation

## 2017-11-24 DIAGNOSIS — Z3481 Encounter for supervision of other normal pregnancy, first trimester: Secondary | ICD-10-CM

## 2017-11-24 DIAGNOSIS — Z3A09 9 weeks gestation of pregnancy: Secondary | ICD-10-CM

## 2017-11-24 DIAGNOSIS — O99611 Diseases of the digestive system complicating pregnancy, first trimester: Secondary | ICD-10-CM

## 2017-11-24 DIAGNOSIS — R110 Nausea: Secondary | ICD-10-CM

## 2017-11-24 LAB — POCT URINALYSIS DIPSTICK
Blood, UA: NEGATIVE
Glucose, UA: NEGATIVE
KETONES UA: NEGATIVE
Leukocytes, UA: NEGATIVE
Nitrite, UA: NEGATIVE
Protein, UA: NEGATIVE

## 2017-11-24 NOTE — Progress Notes (Signed)
INITIAL OBSTETRICAL VISIT Patient name: Nicole Le MRN [id number]  Date of birth: 10-May-1986 Chief Complaint:   Initial Prenatal Visit (constipation)  History of Present Illness:   Nicole Le is a 32 y.o. G37P0010 Hispanic female at [redacted]w[redacted]d by 6wk u/s, with an Estimated Date of Delivery: 06/24/18 being seen today for her initial obstetrical visit.   Her obstetrical history is significant for missed Ab w/ D&C.   Today she reports some constipation, mild nausea, pressure/cramping- no bleeding.  Patient's last menstrual period was 09/10/2017 (exact date). Last pap 08/2017 at [facility]. Results were: normal Review of Systems:   Pertinent items are noted in HPI Denies cramping/contractions, leakage of fluid, vaginal bleeding, abnormal vaginal discharge w/ itching/odor/irritation, headaches, visual changes, shortness of breath, chest pain, abdominal pain, severe nausea/vomiting, or problems with urination or bowel movements unless otherwise stated above.  Pertinent History Reviewed:  Reviewed past medical,surgical, social, obstetrical and family history.  Reviewed problem list, medications and allergies. OB History  Gravida Para Term Preterm AB Living  2       1    SAB TAB Ectopic Multiple Live Births  1            # Outcome Date GA Lbr Len/2nd Weight Sex Delivery Anes PTL Lv  2 Current           1 SAB 02/13/12 [redacted]w[redacted]d            Birth Comments: System Generated. Please review and update pregnancy details.   Physical Assessment:   Vitals:   11/24/17 1126  BP: 110/72  Pulse: 80  Weight: 179 lb (81.2 kg)  Body mass index is 32.74 kg/m.       Physical Examination:  General appearance - well appearing, and in no distress  Mental status - alert, oriented to person, place, and time  Psych:  She has a normal mood and affect  Skin - warm and dry, normal color, no suspicious lesions noted  Chest - effort normal, all lung fields clear to auscultation bilaterally  Heart -  normal rate and regular rhythm  Abdomen - soft, nontender  Extremities:  No swelling or varicosities noted  Thin prep pap is not done   Fetal Heart Rate (bpm): +u/s via informal transabdominal u/s  Results for orders placed or performed in visit on 11/24/17 (from the past 24 hour(s))  POCT urinalysis dipstick   Collection Time: 11/24/17 11:58 AM  Result Value Ref Range   Color, UA     Clarity, UA     Glucose, UA Negative Negative   Bilirubin, UA     Ketones, UA neg    Spec Grav, UA  1.010 - 1.025   Blood, UA neg    pH, UA  5.0 - 8.0   Protein, UA Negative Negative   Urobilinogen, UA  0.2 or 1.0 E.U./dL   Nitrite, UA neg    Leukocytes, UA Negative Negative   Appearance     Odor      Assessment & Plan:  1) Low-Risk Pregnancy G2P0010 at [redacted]w[redacted]d with an Estimated Date of Delivery: 06/24/18   2) Initial OB visit  3) Constipation> gave printed prevention/relief measures   4) Pressure/cramping> will send urine cx, discussed warning s/s, reasons to seek care  Meds: No orders of the defined types were placed in this encounter.   Initial labs obtained Continue prenatal vitamins Reviewed n/v relief measures and warning s/s to report Reviewed recommended weight gain based on pre-gravid BMI Encouraged  well-balanced diet Genetic Screening discussed Integrated Screen: requested Cystic fibrosis screening discussed requested Ultrasound discussed; fetal survey: requested [place] completed>spoke w/ [place] used for encounter  Follow-up: Return in about 3 weeks (around 12/17/2017) for LROB, US:NT+1stIT.   Orders Placed This Encounter  Procedures  . Urine Culture  . GC/Chlamydia Probe Amp  . US Fetal Nuchal Translucency Measurement  . Obstetric Panel, Including HIV  . Urinalysis, Routine w reflex microscopic  . Cystic Fibrosis Mutation 97  . Pain Management Screening Profile (10S)  . POCT urinalysis dipstick    Roma Schanz CNM, [facility] 11/24/2017 12:13 PM

## 2017-11-24 NOTE — Patient Instructions (Signed)
Nicole Le, I greatly value your feedback.  If you receive a survey following your visit with [place] today, we appreciate you taking the time to fill it out.  Thanks, Knute Neu, CNM, WHNP-BC   Nausea & Vomiting  Have saltine crackers or pretzels by your bed and eat a few bites before you raise your head out of bed in the morning  Eat small frequent meals throughout the day instead of large meals  Drink plenty of fluids throughout the day to stay hydrated, just don't drink a lot of fluids with your meals.  This can make your stomach fill up faster making you feel sick  Do not brush your teeth right after you eat  Products with real ginger are good for nausea, like ginger ale and ginger hard candy Make sure it says made with real ginger!  Sucking on sour candy like lemon heads is also good for nausea  If your prenatal vitamins make you nauseated, take them at night so you will sleep through the nausea  Sea Bands  If you feel like you need medicine for the nausea & vomiting please let us know  If you are unable to keep any fluids or food down please let us know   Constipation  Drink plenty of fluid, preferably water, throughout the day  Eat foods high in fiber such as fruits, vegetables, and grains  Exercise, such as walking, is a good way to keep your bowels regular  Drink warm fluids, especially warm prune juice, or decaf coffee  Eat a 1/2 cup of real oatmeal (not instant), 1/2 cup applesauce, and 1/2-1 cup warm prune juice every day  If needed, you may take Colace (docusate sodium) stool softener once or twice a day to help keep the stool soft. If you are pregnant, wait until you are out of your first trimester (12-14 weeks of pregnancy)  If you still are having problems with constipation, you may take Miralax once daily as needed to help keep your bowels regular.  If you are pregnant, wait until you are out of your first trimester (12-14 weeks of  pregnancy)   Primer trimestre de Media planner (First Trimester of Pregnancy) El primer trimestre de Media planner se extiende desde la semana1 hasta el final de la semana12 (mes1 al mes3). Durante este tiempo, el beb comenzar a desarrollarse dentro suyo. Entre la semana6 y [place], se forman los ojos y [place], y los latidos del corazn pueden verse en la ecografa. Al final de las 12semanas, todos los rganos del beb estn formados. La atencin prenatal es toda la asistencia mdica que usted recibe antes del nacimiento del beb. Asegrese de recibir una buena atencin prenatal y de seguir todas las indicaciones del mdico. CUIDADOS EN EL HOGAR Medicamentos:  Tome los medicamentos solamente como se lo haya indicado el mdico. Algunos medicamentos se pueden tomar durante el Media planner y otros no.  Tome las vitaminas prenatales como se lo haya indicado el [place] medicamento que la ayuda a Landscape architect (laxante [place]) segn sea necesario, si el mdico lo autoriza. Dieta  Ingiera alimentos saludables de [place] regular.  El Buyer, retail la cantidad de peso que [place].  No coma carne cruda ni quesos sin cocinar.  Si tiene Higher education careers adviser (nuseas) o vomita: ? Ingiera 4 o 5comidas pequeas por TEFL teacher de 3abundantes. ? Intente comer algunas galletitas saladas. ? Beba lquidos [employer], en lugar de [employer].  Si tiene dificultad  para defecar (estreimiento): ? Consuma alimentos con alto contenido de [place], como verduras y frutas frescos, y Psychologist, prison and probation services. ? Beba suficiente lquido para mantener el pis (orina) claro o de color amarillo plido. Actividad y ejercicios  Haga ejercicios solamente como se lo haya indicado el mdico. Deje de hacer ejercicios si tiene clicos o dolor en la parte baja del vientre (abdomen) o en la cintura.  Intente no estar de pie [employer]. Mueva las piernas con frecuencia si debe estar de pie en un  lugar durante mucho tiempo.  Evite levantar pesos [employer].  Use zapatos con tacones bajos. Mantenga una buena postura al sentarse y pararse.  Puede tener [employer], a menos que el mdico le indique lo contrario. Alivio del [place] molestias  Use un sostn que le brinde buen soporte si le duelen las [place].  Dese baos con agua tibia (baos de asiento) para Best boy o las molestias a causa de las hemorroides. Use crema antihemorroidal si el mdico se lo permite.  Descanse con las piernas elevadas si tiene calambres o dolor de cintura.  Use medias de descanso si tiene las venas de las piernas hinchadas y abultadas (venas varicosas). Eleve los pies durante 46minutos, 3 o 4veces por da. Limite la cantidad de sal en su dieta. Cuidados prenatales  Programe las visitas prenatales para la semana12 de [place].  Escriba sus preguntas. Llvelas cuando concurra a las visitas prenatales.  Concurra a todas las visitas prenatales como se lo haya indicado el mdico. Seguridad  Colquese el cinturn de seguridad cuando conduzca.  Haga una lista con los nmeros de telfono en caso de Freight forwarder, en la cual deben incluirse los nmeros de los familiares, los amigos, el hospital y los departamentos de polica y de bomberos. Consejos generales  [place] al mdico que la derive a clases prenatales en su localidad. Debe comenzar a tomar las clases antes de Dietitian en el mes6 de embarazo.  Pida ayuda si necesita asesoramiento o asistencia con la alimentacin. El mdico puede aconsejarla o indicarle dnde recurrir para recibir [place].  No se d baos de inmersin en agua caliente, baos turcos ni saunas.  No se haga duchas vaginales ni use tampones o toallas higinicas perfumadas.  No mantenga las piernas cruzadas durante mucho tiempo.  Evite el contacto con las bandejas sanitarias de los gatos y la tierra que estos animales usan.  No fume, no consuma hierbas ni beba alcohol.  No tome frmacos que el mdico no haya autorizado.  No consuma ningn producto que contenga tabaco, lo que incluye cigarrillos, tabaco de Higher education careers adviser o Psychologist, sport and exercise. Si necesita ayuda para dejar de fumar, consulte al [employer]. Puede recibir asesoramiento u otro tipo de apoyo para dejar de fumar.  Visite al dentista. En su casa, lvese los dientes con un cepillo dental suave. Psese el hilo dental con suavidad. SOLICITE AYUDA SI:  Tiene mareos.  Tiene clicos leves o siente presin en la parte baja del vientre.  Siente un dolor persistente en la zona del vientre.  Sigue teniendo [employer], vomita o las heces son lquidas (diarrea).  Observa una secrecin, con mal olor que proviene de la vagina.  Siente dolor al [employer].  Tiene el rostro, las [place], las piernas o los tobillos ms hinchados (inflamados).  SOLICITE AYUDA DE INMEDIATO SI:  Tiene fiebre.  Tiene una prdida de lquido por la vagina.  Tiene sangrado o pequeas prdidas vaginales.  Tiene clicos o dolor muy intensos en el vientre.  [place]  o baja de peso rpidamente.  Vomita sangre. Puede ser similar a la borra del caf  Est en contacto con personas que tienen rubola, la quinta enfermedad o varicela.  Siente un dolor de cabeza muy intenso.  Le falta el aire.  Sufre cualquier tipo de traumatismo, por ejemplo, debido a una cada o un accidente automovilstico.  Esta informacin no tiene Marine scientist el consejo del mdico. Asegrese de hacerle al mdico cualquier pregunta que tenga. Document Released: 09/06/2008 Document Revised: 07/01/2014 Document Reviewed: 04/20/2013 Elsevier Interactive Patient Education  2017 [employer].

## 2017-11-25 LAB — PMP SCREEN PROFILE (10S), URINE
Amphetamine Scrn, Ur: NEGATIVE ng/mL
BARBITURATE SCREEN URINE: NEGATIVE ng/mL
BENZODIAZEPINE SCREEN, URINE: NEGATIVE ng/mL
CANNABINOIDS UR QL SCN: NEGATIVE ng/mL
CREATININE(CRT), U: 56.1 mg/dL (ref 20.0–300.0)
Cocaine (Metab) Scrn, Ur: NEGATIVE ng/mL
Methadone Screen, Urine: NEGATIVE ng/mL
OPIATE SCREEN URINE: NEGATIVE ng/mL
OXYCODONE+OXYMORPHONE UR QL SCN: NEGATIVE ng/mL
Ph of Urine: 5.8 (ref 4.5–8.9)
Phencyclidine Qn, Ur: NEGATIVE ng/mL
Propoxyphene Scrn, Ur: NEGATIVE ng/mL

## 2017-11-25 LAB — MED LIST OPTION NOT SELECTED

## 2017-11-26 LAB — URINE CULTURE

## 2017-11-26 LAB — GC/CHLAMYDIA PROBE AMP
Chlamydia trachomatis, NAA: NEGATIVE
Neisseria gonorrhoeae by PCR: NEGATIVE

## 2017-12-02 LAB — URINALYSIS, ROUTINE W REFLEX MICROSCOPIC
BILIRUBIN UA: NEGATIVE
GLUCOSE, UA: NEGATIVE
KETONES UA: NEGATIVE
Leukocytes, UA: NEGATIVE
Nitrite, UA: NEGATIVE
PH UA: 6.5 (ref 5.0–7.5)
PROTEIN UA: NEGATIVE
RBC UA: NEGATIVE
Specific Gravity, UA: 1.01 (ref 1.005–1.030)
UUROB: 0.2 mg/dL (ref 0.2–1.0)

## 2017-12-02 LAB — OBSTETRIC PANEL, INCLUDING HIV
Antibody Screen: NEGATIVE
BASOS ABS: 0 10*3/uL (ref 0.0–0.2)
BASOS: 0 %
EOS (ABSOLUTE): 0.1 10*3/uL (ref 0.0–0.4)
EOS: 1 %
HEMATOCRIT: 40.8 % (ref 34.0–46.6)
HEMOGLOBIN: 13.3 g/dL (ref 11.1–15.9)
HIV Screen 4th Generation wRfx: NONREACTIVE
Hepatitis B Surface Ag: NEGATIVE
IMMATURE GRANULOCYTES: 0 %
Immature Grans (Abs): 0 10*3/uL (ref 0.0–0.1)
LYMPHS ABS: 2 10*3/uL (ref 0.7–3.1)
LYMPHS: 26 %
MCH: 30.2 pg (ref 26.6–33.0)
MCHC: 32.6 g/dL (ref 31.5–35.7)
MCV: 93 fL (ref 79–97)
Monocytes Absolute: 0.5 10*3/uL (ref 0.1–0.9)
Monocytes: 7 %
Neutrophils Absolute: 5.2 10*3/uL (ref 1.4–7.0)
Neutrophils: 66 %
Platelets: 244 10*3/uL (ref 150–450)
RBC: 4.4 x10E6/uL (ref 3.77–5.28)
RDW: 13.8 % (ref 12.3–15.4)
RPR Ser Ql: NONREACTIVE
Rh Factor: POSITIVE
Rubella Antibodies, IGG: 19.6 index (ref >0.99)
WBC: 7.8 10*3/uL (ref 3.4–10.8)

## 2017-12-02 LAB — CYSTIC FIBROSIS MUTATION 97: GENE DIS ANAL CARRIER INTERP BLD/T-IMP: NOT DETECTED

## 2017-12-19 ENCOUNTER — Ambulatory Visit (INDEPENDENT_AMBULATORY_CARE_PROVIDER_SITE_OTHER): Payer: Self-pay

## 2017-12-19 ENCOUNTER — Ambulatory Visit (INDEPENDENT_AMBULATORY_CARE_PROVIDER_SITE_OTHER): Payer: Self-pay | Admitting: Obstetrics and Gynecology

## 2017-12-19 ENCOUNTER — Encounter: Payer: Self-pay | Admitting: Obstetrics and Gynecology

## 2017-12-19 VITALS — BP 107/68 | HR 64 | Wt 170.2 lb

## 2017-12-19 DIAGNOSIS — Z3A13 13 weeks gestation of pregnancy: Secondary | ICD-10-CM

## 2017-12-19 DIAGNOSIS — Z3481 Encounter for supervision of other normal pregnancy, first trimester: Secondary | ICD-10-CM

## 2017-12-19 DIAGNOSIS — Z331 Pregnant state, incidental: Secondary | ICD-10-CM

## 2017-12-19 DIAGNOSIS — Z1389 Encounter for screening for other disorder: Secondary | ICD-10-CM

## 2017-12-19 DIAGNOSIS — Z3682 Encounter for antenatal screening for nuchal translucency: Secondary | ICD-10-CM

## 2017-12-19 LAB — POCT URINALYSIS DIPSTICK
GLUCOSE UA: NEGATIVE
Ketones, UA: NEGATIVE
Leukocytes, UA: NEGATIVE
Nitrite, UA: NEGATIVE
Protein, UA: NEGATIVE
RBC UA: NEGATIVE

## 2017-12-19 NOTE — Progress Notes (Signed)
Patient ID: Nicole Le, female   DOB: Oct 23, 1985, 32 y.o.   MRN: [id number]    LOW-RISK PREGNANCY VISIT Patient name: Nicole Le MRN [id number]  Date of birth: 07/14/1985 Chief Complaint:   Routine Prenatal Visit (1st IT)  History of Present Illness:   Nicole Le is a 32 y.o. G2P0010 female at [redacted]w[redacted]d with an Estimated Date of Delivery: 06/24/18 being seen today for ongoing management of a low-risk pregnancy.  Today she reports no complaints. She claims she can feel the baby moving and struggles to bend over sometimes.    . Vag. Bleeding: None.   . denies leaking of fluid. Review of Systems:   Pertinent items are noted in HPI Denies abnormal vaginal discharge w/ itching/odor/irritation, headaches, visual changes, shortness of breath, chest pain, abdominal pain, severe nausea/vomiting, or problems with urination or bowel movements unless otherwise stated above. Pertinent History Reviewed:  Reviewed past medical,surgical, social, obstetrical and family history.  Reviewed problem list, medications and allergies. Physical Assessment:   Vitals:   12/19/17 1151  BP: 107/68  Pulse: 64  Weight: 170 lb 3.2 oz (77.2 kg)  Body mass index is 31.13 kg/m.        Physical Examination:   General appearance: Well appearing, and in no distress  Mental status: Alert, oriented to person, place, and time  Skin: Warm & dry  Cardiovascular: Normal heart rate noted  Respiratory: Normal respiratory effort, no distress  Abdomen: Soft, gravid, nontender  Pelvic: Cervical exam deferred         Extremities: Edema: None  Fetal Status:          Results for orders placed or performed in visit on 12/19/17 (from the past 24 hour(s))  POCT urinalysis dipstick   Collection Time: 12/19/17 11:52 AM  Result Value Ref Range   Color, UA     Clarity, UA     Glucose, UA Negative Negative   Bilirubin, UA     Ketones, UA neg    Spec Grav, UA  1.010 - 1.025   Blood, UA neg     pH, UA  5.0 - 8.0   Protein, UA Negative Negative   Urobilinogen, UA  0.2 or 1.0 E.U./dL   Nitrite, UA neg    Leukocytes, UA Negative Negative   Appearance     Odor      Assessment & Plan:  1) Low-risk pregnancy G2P0010 at [redacted]w[redacted]d with an Estimated Date of Delivery: 06/24/18    Meds: No orders of the defined types were placed in this encounter.  Labs/procedures today: [place] 13+2 wks,measurements c/w dates,NB present,NT 1.4 mm,normal ovaries bilat,anterior pl gr 0 fhr 150 bpm 1st IT drawn  Plan:  Continue routine obstetrical care  Follow-up: Return in about 1 month (around 01/16/2018) for [place].  Orders Placed This Encounter  Procedures  . Integrated 1  . POCT urinalysis dipstick   By signing my name below, I, De Burrs, attest that this documentation has been prepared under the direction and in the presence of Jonnie Kind, MD. Electronically Signed: De Burrs, Medical Scribe. 12/19/17. 11:57 AM.  I personally performed the services described in this documentation, which was SCRIBED in my presence. The recorded information has been reviewed and considered accurate. It has been edited as necessary during review. Jonnie Kind, MD

## 2017-12-19 NOTE — Progress Notes (Signed)
[place] 13+2 wks,measurements c/w dates,NB present,NT 1.4 mm,normal ovaries bilat,anterior pl gr 0 fhr 150 bpm

## 2017-12-23 LAB — INTEGRATED 1
CROWN RUMP LENGTH MAT SCREEN: 71.8 mm
GEST. AGE ON COLLECTION DATE: 13.1 wk
MATERNAL AGE AT EDD: 35 a
NUMBER OF FETUSES: 1
Nuchal Translucency (NT): 1.4 mm
PAPP-A VALUE: 648.9 ng/mL
WEIGHT: 170 [lb_av]

## 2018-01-08 ENCOUNTER — Telehealth: Payer: Self-pay | Admitting: Women's Health

## 2018-01-08 MED ORDER — PRENATAL PLUS 27-1 MG PO TABS: 1.0000 | ORAL_TABLET | Freq: Every day | ORAL | 12 refills | Status: DC

## 2018-01-08 NOTE — Telephone Encounter (Signed)
Patient called stating that she needs a refill of her prenatal vitamins sent to her pharmacy @ [facility] in [place]. Please contact pt when sent

## 2018-01-14 ENCOUNTER — Emergency Department (HOSPITAL_COMMUNITY): Payer: Self-pay

## 2018-01-14 ENCOUNTER — Emergency Department (HOSPITAL_COMMUNITY)
Admission: EM | Admit: 2018-01-14 | Discharge: 2018-01-14 | Disposition: A | Discharge status: Home / Self Care | Payer: Self-pay | Attending: Emergency Medicine | Admitting: Emergency Medicine

## 2018-01-14 ENCOUNTER — Encounter (HOSPITAL_COMMUNITY): Payer: Self-pay | Admitting: *Deleted

## 2018-01-14 DIAGNOSIS — R1030 Lower abdominal pain, unspecified: Secondary | ICD-10-CM

## 2018-01-14 DIAGNOSIS — Z3A16 16 weeks gestation of pregnancy: Secondary | ICD-10-CM | POA: Insufficient documentation

## 2018-01-14 DIAGNOSIS — M545 Low back pain: Secondary | ICD-10-CM | POA: Insufficient documentation

## 2018-01-14 DIAGNOSIS — O26892 Other specified pregnancy related conditions, second trimester: Secondary | ICD-10-CM | POA: Insufficient documentation

## 2018-01-14 DIAGNOSIS — O9989 Other specified diseases and conditions complicating pregnancy, childbirth and the puerperium: Secondary | ICD-10-CM | POA: Insufficient documentation

## 2018-01-14 LAB — COMPREHENSIVE METABOLIC PANEL
ALT: 18 U/L (ref 0–44)
AST: 19 U/L (ref 15–41)
Albumin: 3.3 g/dL — ABNORMAL LOW (ref 3.5–5.0)
Alkaline Phosphatase: 45 U/L (ref 38–126)
Anion gap: 9 (ref 5–15)
BUN: 5 mg/dL — ABNORMAL LOW (ref 6–20)
CO2: 21 mmol/L — ABNORMAL LOW (ref 22–32)
Calcium: 8.9 mg/dL (ref 8.9–10.3)
Chloride: 106 mmol/L (ref 98–111)
Creatinine, Ser: 0.52 mg/dL (ref 0.44–1.00)
GFR calc Af Amer: >60 mL/min (ref >60)
GFR calc non Af Amer: >60 mL/min (ref >60)
Glucose, Bld: 83 mg/dL (ref 70–99)
Potassium: 3.7 mmol/L (ref 3.5–5.1)
Sodium: 136 mmol/L (ref 135–145)
Total Bilirubin: 0.4 mg/dL (ref 0.3–1.2)
Total Protein: 6.3 g/dL — ABNORMAL LOW (ref 6.5–8.1)

## 2018-01-14 LAB — URINALYSIS, ROUTINE W REFLEX MICROSCOPIC
Bilirubin Urine: NEGATIVE
Glucose, UA: NEGATIVE mg/dL
Hgb urine dipstick: NEGATIVE
Ketones, ur: 5 mg/dL — AB
Leukocytes, UA: NEGATIVE
Nitrite: NEGATIVE
Protein, ur: NEGATIVE mg/dL
Specific Gravity, Urine: 1.003 — ABNORMAL LOW (ref 1.005–1.030)
pH: 5 (ref 5.0–8.0)

## 2018-01-14 LAB — CBC
HCT: 36 % (ref 36.0–46.0)
Hemoglobin: 12.1 g/dL (ref 12.0–15.0)
MCH: 31 pg (ref 26.0–34.0)
MCHC: 33.6 g/dL (ref 30.0–36.0)
MCV: 92.3 fL (ref 78.0–100.0)
Platelets: 212 10*3/uL (ref 150–400)
RBC: 3.9 MIL/uL (ref 3.87–5.11)
RDW: 12.8 % (ref 11.5–15.5)
WBC: 8.7 10*3/uL (ref 4.0–10.5)

## 2018-01-14 LAB — LIPASE, BLOOD: Lipase: 29 U/L (ref 11–51)

## 2018-01-14 MED ORDER — ACETAMINOPHEN 325 MG PO TABS
650.0000 mg | ORAL_TABLET | Freq: Once | ORAL | Status: AC
  Administered 2018-01-14: 650 mg via ORAL
  Filled 2018-01-14: qty 2

## 2018-01-14 NOTE — ED Notes (Signed)
Patient transported to Ultrasound 

## 2018-01-14 NOTE — ED Triage Notes (Addendum)
Pt in stating she is [redacted] weeks pregnant and is having abdominal pain, started today, unsure if she is feeling baby move as much, denies vaginal bleeding or vaginal discharge, denies n/v, reports pain is constant, pt has had a miscarriage in the past at 8 weeks, G5, P3

## 2018-01-14 NOTE — ED Provider Notes (Signed)
[facility] Provider Note   CSN: [id number] Arrival date & time: 01/14/18  1321     History   Chief Complaint Chief Complaint  Patient presents with  . Abdominal Pain    HPI Nicole Le is a 32 y.o. female.  HPI   Language barrier.  Interpreter used.  32 year old female with abdominal pain.  Pain is across the lower abdomen and radiates somewhat into her lower back.  Describes as an ache.  Constant since onset.  No appreciable exacerbating factors.  She is approximately [redacted] weeks pregnant.  She denies any vaginal bleeding or leakage of fluid.  No nausea vomiting.  No urinary complaints.  Past Medical History:  Diagnosis Date  . No pertinent past medical history     Patient Active Problem List   Diagnosis Date Noted  . Supervision of normal pregnancy 11/24/2017  . Missed abortion 9 wks 02/13/2012    Past Surgical History:  Procedure Laterality Date  . DILATION AND CURETTAGE OF UTERUS  02/13/2012   Procedure: DILATATION AND CURETTAGE;  Surgeon: Jonnie Kind, MD;  Location: [facility];  Service: Gynecology;  Laterality: N/A;  Suction      OB History    Gravida  2   Para      Term      Preterm      AB  1   Living        SAB  1   TAB      Ectopic      Multiple      Live Births               Home Medications    Prior to Admission medications   Medication Sig Start Date End Date Taking? Authorizing Provider  acetaminophen (TYLENOL) 500 MG tablet Take 250 mg by mouth every 6 (six) hours as needed for mild pain.   Yes [provider]  prenatal vitamin w/FE, FA (PRENATAL 1 + 1) 27-1 MG TABS tablet Take 1 tablet by mouth daily at 12 noon. 01/08/18  Yes Roma Schanz, CNM    Family History Family History  Problem Relation Age of Onset  . Hypertension Mother   . Diabetes Mother   . Hypertension Sister   . Diabetes Brother   . Hypertension Maternal Uncle   . Hypertension Paternal  Uncle     Social History Social History   Tobacco Use  . Smoking status: Never Smoker  . Smokeless tobacco: Never Used  Substance Use Topics  . Alcohol use: No  . Drug use: No     Allergies   Patient has no known allergies.   Review of Systems Review of Systems  All systems reviewed and negative, other than as noted in HPI.  Physical Exam Updated Vital Signs BP 128/87 (BP Location: Right Arm)   Pulse 82   Temp 97.8 F (36.6 C) (Oral)   Resp 16   LMP 09/10/2017 (Exact Date)   SpO2 100%   Physical Exam  Constitutional: She appears well-developed and well-nourished. No distress.  HENT:  Head: Normocephalic and atraumatic.  Eyes: Conjunctivae are normal. Right eye exhibits no discharge. Left eye exhibits no discharge.  Neck: Neck supple.  Cardiovascular: Normal rate, regular rhythm and normal heart sounds. Exam reveals no gallop and no friction rub.  No murmur heard. Pulmonary/Chest: Effort normal and breath sounds normal. No respiratory distress.  Abdominal: Soft. She exhibits no distension. There is no tenderness.  Mild tenderness  of the lower abdomen and up into the right upper quadrant without rebound or guarding.  Musculoskeletal: She exhibits no edema or tenderness.  Neurological: She is alert.  Skin: Skin is warm and dry.  Psychiatric: She has a normal mood and affect. Her behavior is normal. Thought content normal.  Nursing note and vitals reviewed.    ED Treatments / Results  Labs (all labs ordered are listed, but only abnormal results are displayed) Labs Reviewed  COMPREHENSIVE METABOLIC PANEL - Abnormal; Notable for the following components:      Result Value   CO2 21 (*)    BUN 5 (*)    Total Protein 6.3 (*)    Albumin 3.3 (*)    All other components within normal limits  URINALYSIS, ROUTINE W REFLEX MICROSCOPIC - Abnormal; Notable for the following components:   Color, Urine STRAW (*)    Specific Gravity, Urine 1.003 (*)    Ketones, ur 5 (*)     Bacteria, UA RARE (*)    All other components within normal limits  LIPASE, BLOOD  CBC    EKG None  Radiology No results found.   US Abdomen Limited  Result Date: 01/14/2018 CLINICAL DATA:  Initial evaluation for acute right-sided abdominal pain. EXAM: ULTRASOUND ABDOMEN LIMITED RIGHT UPPER QUADRANT COMPARISON:  None. FINDINGS: Gallbladder: No gallstones or wall thickening visualized. No sonographic Murphy sign noted by sonographer. Common bile duct: Diameter: 2.8 mm Liver: No focal lesion identified. Diffusely increased echogenicity, suggesting steatosis. Portal vein is patent on color Doppler imaging with normal direction of blood flow towards the liver. IMPRESSION: 1. Normal sonographic appearance of the gallbladder with no cholelithiasis or evidence for acute cholecystitis. No biliary dilatation. 2. Increased hepatic echogenicity, suggesting steatosis. Electronically Signed   By: Jeannine Boga M.D.   On: 01/14/2018 19:43    Procedures Procedures (including critical care time)  Medications Ordered in ED Medications  acetaminophen (TYLENOL) tablet 650 mg (650 mg Oral Given 01/14/18 1622)     Initial Impression / Assessment and Plan / ED Course  I have reviewed the triage vital signs and the nursing notes.  Pertinent labs & imaging results that were available during my care of the patient were reviewed by me and considered in my medical decision making (see chart for details).     32yF with abdominal pain in pregnancy. 16w. Minimal tenderness. Reassuring w/u. I doubt emergent process. OB FU.   It has been determined that no acute conditions requiring further emergency intervention are present at this time. The patient has been advised of the diagnosis and plan. I reviewed any labs and imaging including any potential incidental findings. We have discussed signs and symptoms that warrant return to the ED and they are listed in the discharge instructions.    Final  Clinical Impressions(s) / ED Diagnoses   Final diagnoses:  Lower abdominal pain    ED Discharge Orders    None       Virgel Manifold, MD 01/18/18 2119

## 2018-01-16 ENCOUNTER — Ambulatory Visit (INDEPENDENT_AMBULATORY_CARE_PROVIDER_SITE_OTHER): Payer: Self-pay | Admitting: Obstetrics and Gynecology

## 2018-01-16 ENCOUNTER — Encounter: Payer: Self-pay | Admitting: Obstetrics and Gynecology

## 2018-01-16 VITALS — BP 102/63 | HR 72 | Wt 172.8 lb

## 2018-01-16 DIAGNOSIS — Z331 Pregnant state, incidental: Secondary | ICD-10-CM

## 2018-01-16 DIAGNOSIS — Z3481 Encounter for supervision of other normal pregnancy, first trimester: Secondary | ICD-10-CM

## 2018-01-16 DIAGNOSIS — Z1389 Encounter for screening for other disorder: Secondary | ICD-10-CM

## 2018-01-16 DIAGNOSIS — Z3A17 17 weeks gestation of pregnancy: Secondary | ICD-10-CM

## 2018-01-16 DIAGNOSIS — Z1379 Encounter for other screening for genetic and chromosomal anomalies: Secondary | ICD-10-CM

## 2018-01-16 LAB — POCT URINALYSIS DIPSTICK OB
GLUCOSE, UA: NEGATIVE — AB
KETONES UA: NEGATIVE
Leukocytes, UA: NEGATIVE
Nitrite, UA: NEGATIVE
POC,PROTEIN,UA: NEGATIVE
RBC UA: NEGATIVE

## 2018-01-16 NOTE — Progress Notes (Signed)
Patient ID: Nicole Le, female   DOB: [birth year], 32 y.o.   MRN: [id number]   LOW-RISK PREGNANCY VISIT Patient name: Nicole Le MRN [id number]  Date of birth: [birth year] Chief Complaint:   low risk ob (2nd IT) Ordered today History of Present Illness:   Nicole Le is a 32 y.o. G2P0010 female at [redacted]w[redacted]d with an Estimated Date of Delivery: 06/24/18 being seen today for ongoing management of a low-risk pregnancy.  Today she reports round ligament pain. She is accompanied by her husband, family and an interpreter. She thought she was going to be able to find out the gender of the baby today, but misheard.  Anatomy scan will be scheduled in 2 weeks  .  Marland Kitchen  Movement: Absent. denies leaking of fluid. Review of Systems:   Pertinent items are noted in HPI Denies abnormal vaginal discharge w/ itching/odor/irritation, headaches, visual changes, shortness of breath, chest pain, abdominal pain, severe nausea/vomiting, or problems with urination or bowel movements unless otherwise stated above. Pertinent History Reviewed:  Reviewed past medical,surgical, social, obstetrical and family history.  Reviewed problem list, medications and allergies. Physical Assessment:   Vitals:   01/16/18 1310  BP: 102/63  Pulse: 72  Weight: 172 lb 12.8 oz (78.4 kg)  Body mass index is 31.61 kg/m.        Physical Examination:   General appearance: Well appearing, and in no distress  Mental status: Alert, oriented to person, place, and time  Skin: Warm & dry  Cardiovascular: Normal heart rate noted  Respiratory: Normal respiratory effort, no distress  Abdomen: Soft, gravid, nontender  Pelvic: Cervical exam deferred         Extremities: Edema: None  Fetal Status: Fetal Heart Rate (bpm): 150   Movement: Absent    Results for orders placed or performed in visit on 01/16/18 (from the past 24 hour(s))  POC Urinalysis Dipstick OB   Collection Time: 01/16/18  1:22 PM  Result Value  Ref Range   Color, UA     Clarity, UA     Glucose, UA Negative (A) (none)   Bilirubin, UA     Ketones, UA neg    Spec Grav, UA  1.010 - 1.025   Blood, UA neg    pH, UA  5.0 - 8.0   POC Protein UA Negative Negative, Trace   Urobilinogen, UA  0.2 or 1.0 E.U./dL   Nitrite, UA neg    Leukocytes, UA Negative Negative   Appearance     Odor      Assessment & Plan:  1) Low-risk pregnancy G2P0010 at [redacted]w[redacted]d with an Estimated Date of Delivery: 06/24/18    2) Round Ligament pain, stable  Meds: No orders of the defined types were placed in this encounter.  Labs/procedures today: 2nd IT  Plan:  Continue routine obstetrical care   Reviewed: Preterm labor symptoms and general obstetric precautions including but not limited to vaginal bleeding, contractions, leaking of fluid and fetal movement were reviewed in detail with the patient.  All questions were answered  Follow-up: Return in about 1 month (around 02/13/2018) for LROB, U/S: ANATOMY.  Orders Placed This Encounter  Procedures  . INTEGRATED 2  . POC Urinalysis Dipstick OB   By signing my name below, I, Margit Banda, attest that this documentation has been prepared under the direction and in the presence of Jonnie Kind, MD. Electronically Signed: Margit Banda, Medical Scribe. 01/16/18. 1:45 PM.  I personally performed the services described in this documentation,  which was SCRIBED in my presence. The recorded information has been reviewed and considered accurate. It has been edited as necessary during review. Jonnie Kind, MD

## 2018-01-20 LAB — INTEGRATED 2
AFP MoM: 1.43
Alpha-Fetoprotein: 46.7 ng/mL
Crown Rump Length: 71.8 mm
DIA MoM: 1.33
DIA Value: 210.2 pg/mL
Estriol, Unconjugated: 1.34 ng/mL
Gest. Age on Collection Date: 13.1 weeks
Gestational Age: 17.1 weeks
Maternal Age at EDD: 35 yr
Nuchal Translucency (NT): 1.4 mm
Nuchal Translucency MoM: 0.85
Number of Fetuses: 1
PAPP-A MoM: 0.65
PAPP-A Value: 648.9 ng/mL
Test Results:: NEGATIVE
Weight: 170 [lb_av]
Weight: 170 [lb_av]
hCG MoM: 1.69
hCG Value: 46 IU/mL
uE3 MoM: 1.32

## 2018-01-29 ENCOUNTER — Other Ambulatory Visit: Payer: Self-pay | Admitting: Obstetrics and Gynecology

## 2018-01-29 DIAGNOSIS — Z363 Encounter for antenatal screening for malformations: Secondary | ICD-10-CM

## 2018-01-30 ENCOUNTER — Ambulatory Visit (INDEPENDENT_AMBULATORY_CARE_PROVIDER_SITE_OTHER): Payer: Self-pay | Admitting: Obstetrics and Gynecology

## 2018-01-30 ENCOUNTER — Encounter: Payer: Self-pay | Admitting: Obstetrics and Gynecology

## 2018-01-30 ENCOUNTER — Other Ambulatory Visit: Payer: Self-pay

## 2018-01-30 ENCOUNTER — Ambulatory Visit (INDEPENDENT_AMBULATORY_CARE_PROVIDER_SITE_OTHER): Payer: Self-pay

## 2018-01-30 VITALS — BP 108/68 | HR 72 | Wt 174.0 lb

## 2018-01-30 DIAGNOSIS — Z3482 Encounter for supervision of other normal pregnancy, second trimester: Secondary | ICD-10-CM

## 2018-01-30 DIAGNOSIS — Z363 Encounter for antenatal screening for malformations: Secondary | ICD-10-CM

## 2018-01-30 DIAGNOSIS — Z331 Pregnant state, incidental: Secondary | ICD-10-CM

## 2018-01-30 DIAGNOSIS — Z3A19 19 weeks gestation of pregnancy: Secondary | ICD-10-CM

## 2018-01-30 DIAGNOSIS — Z1389 Encounter for screening for other disorder: Secondary | ICD-10-CM

## 2018-01-30 LAB — POCT URINALYSIS DIPSTICK OB
Blood, UA: NEGATIVE
GLUCOSE, UA: NEGATIVE — AB
Ketones, UA: NEGATIVE
LEUKOCYTES UA: NEGATIVE
NITRITE UA: NEGATIVE
PROTEIN: NEGATIVE

## 2018-01-30 MED ORDER — CIPROFLOXACIN HCL 500 MG PO TABS: 500.0000 mg | ORAL_TABLET | Freq: Two times a day (BID) | ORAL | 1 refills | Status: DC

## 2018-01-30 NOTE — Progress Notes (Signed)
Patient ID: Nicole Le, female   DOB: 25-Jul-1985, 32 y.o.   MRN: [id number]    LOW-RISK PREGNANCY VISIT Patient name: Nicole Le MRN [id number]  Date of birth: Jun 22, 1986 Chief Complaint:   Routine Prenatal Visit (u/s today)  History of Present Illness:   Nicole Le is a 32 y.o. [phone] female at [redacted]w[redacted]d with an Estimated Date of Delivery: 06/24/18. being seen today for ongoing management of a low-risk pregnancy. She is accompanied by an interpreter and the father of the baby. Today she reports no complaints. They are keeping the sex of the child a secret to the father. This is her fourth child. She plans on breastfeeding and they have not discussed birth control methods after this pregnancy, but this is going to be their last child. She did not like injections or BCPs.     . Vag. Bleeding: None.  Movement: Present. denies leaking of fluid. Review of Systems:   Pertinent items are noted in HPI Denies abnormal vaginal discharge w/ itching/odor/irritation, headaches, visual changes, shortness of breath, chest pain, abdominal pain, severe nausea/vomiting, or problems with urination or bowel movements unless otherwise stated above. Pertinent History Reviewed:  Reviewed past medical,surgical, social, obstetrical and family history.  Reviewed problem list, medications and allergies. Physical Assessment:   Vitals:   01/30/18 1130  BP: 108/68  Pulse: 72  Weight: 174 lb (78.9 kg)  Body mass index is 31.83 kg/m.        Physical Examination:   General appearance: Well appearing, and in no distress  Mental status: Alert, oriented to person, place, and time  Skin: Warm & dry  Cardiovascular: Normal heart rate noted  Respiratory: Normal respiratory effort, no distress  Abdomen: Soft, gravid, nontender  Pelvic: Cervical exam deferred         Extremities: Edema: None  Fetal Status:     Movement: Present    Results for orders placed or performed in visit  on 01/30/18 (from the past 24 hour(s))  POC Urinalysis Dipstick OB   Collection Time: 01/30/18 11:31 AM  Result Value Ref Range   Color, UA     Clarity, UA     Glucose, UA Negative (A) (none)   Bilirubin, UA     Ketones, UA neg    Spec Grav, UA     Blood, UA neg    pH, UA     POC Protein UA Negative Negative, Trace   Urobilinogen, UA     Nitrite, UA neg    Leukocytes, UA Negative Negative   Appearance     Odor      Assessment & Plan:  1) Low-risk pregnancy [id number] at [redacted]w[redacted]d with an Estimated Date of Delivery: 06/24/18.   2 contraception management : uncertain of contraception type, but wants NO MORE babies. Meds: No orders of the defined types were placed in this encounter.  Discussion: 1. Discussed with pt risks and benefits of various contraceptive methods At end of discussion, pt had opportunity to ask questions and has no further questions at this time.  Specific discussion of contraception as noted above. Greater than 50% was spent in counseling and coordination of care with the patient.  Total time greater than: 5 minutes.   Labs/procedures today: [place]: anatomy  Plan:  Continue routine obstetrical care             Offered contraception info Follow-up: Return in about 4 weeks (around 02/27/2018) for LROB.  Orders Placed This Encounter  Procedures  . POC  Urinalysis Dipstick OB   By signing my name below, I, De Burrs, attest that this documentation has been prepared under the direction and in the presence of Jonnie Kind, MD. Electronically Signed: De Burrs, Medical Scribe. 01/30/18. 11:36 AM.  I personally performed the services described in this documentation, which was SCRIBED in my presence. The recorded information has been reviewed and considered accurate. It has been edited as necessary during review. Jonnie Kind, MD

## 2018-01-30 NOTE — Progress Notes (Signed)
[place] 98+0 wks,cephalic,cx 4.9 cm,anterior pl gr 0,normal ovaries bilat,svp of fluid 5.9 cm,fhr 144 bpm,efw 328 g,anatomy complete,no obvious abnormalities

## 2018-02-13 ENCOUNTER — Other Ambulatory Visit: Payer: Self-pay | Admitting: Obstetrics and Gynecology

## 2018-02-13 DIAGNOSIS — Z0375 Encounter for suspected cervical shortening ruled out: Secondary | ICD-10-CM

## 2018-02-16 ENCOUNTER — Ambulatory Visit (INDEPENDENT_AMBULATORY_CARE_PROVIDER_SITE_OTHER): Payer: Self-pay

## 2018-02-16 DIAGNOSIS — Z0375 Encounter for suspected cervical shortening ruled out: Secondary | ICD-10-CM

## 2018-02-16 NOTE — Progress Notes (Signed)
[place] TA/TV 43+3 wks,cephalic,cx 4.5 cm,anterior pl gr 0,svp of fluid 6.3 cm,fhr 144 bpm,prominent fetal stomach,cervical length 3.2 w/o 3.1 w/ pressure

## 2018-02-20 ENCOUNTER — Ambulatory Visit (INDEPENDENT_AMBULATORY_CARE_PROVIDER_SITE_OTHER): Payer: Self-pay | Admitting: Obstetrics and Gynecology

## 2018-02-20 ENCOUNTER — Encounter: Payer: Self-pay | Admitting: Obstetrics and Gynecology

## 2018-02-20 VITALS — Wt 180.2 lb

## 2018-02-20 DIAGNOSIS — Z331 Pregnant state, incidental: Secondary | ICD-10-CM

## 2018-02-20 DIAGNOSIS — Z3482 Encounter for supervision of other normal pregnancy, second trimester: Secondary | ICD-10-CM

## 2018-02-20 DIAGNOSIS — Z3A22 22 weeks gestation of pregnancy: Secondary | ICD-10-CM

## 2018-02-20 DIAGNOSIS — Z1389 Encounter for screening for other disorder: Secondary | ICD-10-CM

## 2018-02-20 LAB — POCT URINALYSIS DIPSTICK OB
GLUCOSE, UA: NEGATIVE
Ketones, UA: NEGATIVE
LEUKOCYTES UA: NEGATIVE
Nitrite, UA: NEGATIVE
POC,PROTEIN,UA: NEGATIVE
RBC UA: NEGATIVE

## 2018-02-20 NOTE — Progress Notes (Signed)
Patient ID: Nicole Le, female   DOB: 1985-08-27, 32 y.o.   MRN: [id number]    LOW-RISK PREGNANCY VISIT Patient name: Nicole Le MRN [id number]  Date of birth: 01/11/1986 Chief Complaint:   Routine Prenatal Visit (talk about vertigo)  History of Present Illness:   Nicole Le is a 32 y.o. [phone] female at [redacted]w[redacted]d with an Estimated Date of Delivery: 06/24/18 being seen today for ongoing management of a low-risk pregnancy. This is 4th child and 5th pregnancy. Has been 2 weeks her ears were clogged and she felt confsued. This past Friday she felt her head was spinning. She has felt this way anytime she did any sudden movements. Any time she is resting or eating she feels sudden dizziness. She has a varicose vein on her left leg with occasional numbness. She has never had varicose veins or swelling in previous pregnancies. Today she reports dizziness. Contractions: Not present.  .  Movement: Present. denies leaking of fluid. Review of Systems:   Pertinent items are noted in HPI Denies abnormal vaginal discharge w/ itching/odor/irritation, headaches, visual changes, shortness of breath, chest pain, abdominal pain, severe nausea/vomiting, or problems with urination or bowel movements unless otherwise stated above. Pertinent History Reviewed:  Reviewed past medical,surgical, social, obstetrical and family history.  Reviewed problem list, medications and allergies. Physical Assessment:   Vitals:   02/20/18 1048  Weight: 180 lb 3.2 oz (81.7 kg)  Body mass index is 32.96 kg/m.        Physical Examination:   General appearance: Well appearing, and in no distress  Mental status: Alert, oriented to person, place, and time  Skin: Warm & dry  Cardiovascular: Normal heart rate noted  Respiratory: Normal respiratory effort, no distress  Abdomen: Soft, gravid, nontender  Pelvic: Cervical exam deferred         Extremities: Edema: None  Left and right ear is normal  appearing, no redness, inner ear looks normal  Tonsils are normal appearing, no redness Fetal Status: Fetal Heart Rate (bpm): 156 Fundal Height: 24 cm Movement: Present    Results for orders placed or performed in visit on 02/20/18 (from the past 24 hour(s))  POC Urinalysis Dipstick OB   Collection Time: 02/20/18 10:52 AM  Result Value Ref Range   Color, UA     Clarity, UA     Glucose, UA Negative Negative   Bilirubin, UA     Ketones, UA neg    Spec Grav, UA     Blood, UA neg    pH, UA     POC Protein UA Negative Negative, Trace   Urobilinogen, UA     Nitrite, UA neg    Leukocytes, UA Negative Negative   Appearance     Odor      Assessment & Plan:  1) Low-risk pregnancy [id number] at [redacted]w[redacted]d with an Estimated Date of Delivery: 06/24/18     Meds: No orders of the defined types were placed in this encounter.  Labs/procedures today: None  Plan:   1. F/u in 4 weeks for routine LROB, u/s:anatomy 2. Return if dizziness worsens 3. Continue routine obstetrical care   Follow-up: Return in about 4 weeks (around 03/20/2018) for LROB, U/s: anatomy.  Orders Placed This Encounter  Procedures  . POC Urinalysis Dipstick OB   By signing my name below, I, Samul Dada, attest that this documentation has been prepared under the direction and in the presence of Jonnie Kind, MD. Electronically Signed: [place]. 02/20/18.  11:14 AM.  I personally performed the services described in this documentation, which was SCRIBED in my presence. The recorded information has been reviewed and considered accurate. It has been edited as necessary during review. Jonnie Kind, MD

## 2018-02-27 ENCOUNTER — Encounter: Payer: Self-pay | Admitting: Obstetrics & Gynecology

## 2018-04-03 ENCOUNTER — Ambulatory Visit (INDEPENDENT_AMBULATORY_CARE_PROVIDER_SITE_OTHER): Payer: Self-pay | Admitting: Women's Health

## 2018-04-03 ENCOUNTER — Encounter: Payer: Self-pay | Admitting: Women's Health

## 2018-04-03 VITALS — BP 101/70 | HR 72 | Wt 188.4 lb

## 2018-04-03 DIAGNOSIS — Z3483 Encounter for supervision of other normal pregnancy, third trimester: Secondary | ICD-10-CM

## 2018-04-03 DIAGNOSIS — O283 Abnormal ultrasonic finding on antenatal screening of mother: Secondary | ICD-10-CM

## 2018-04-03 DIAGNOSIS — Z1389 Encounter for screening for other disorder: Secondary | ICD-10-CM

## 2018-04-03 DIAGNOSIS — Z331 Pregnant state, incidental: Secondary | ICD-10-CM

## 2018-04-03 DIAGNOSIS — Z3A28 28 weeks gestation of pregnancy: Secondary | ICD-10-CM

## 2018-04-03 LAB — POCT URINALYSIS DIPSTICK OB
GLUCOSE, UA: NEGATIVE
LEUKOCYTES UA: NEGATIVE
Nitrite, UA: NEGATIVE
PROTEIN: NEGATIVE
RBC UA: NEGATIVE

## 2018-04-03 NOTE — Progress Notes (Signed)
   LOW-RISK PREGNANCY VISIT Patient name: Nicole Le MRN [id number]  Date of birth: [birth year] Chief Complaint:   Routine Prenatal Visit (vaginal area has vein stick out)  History of Present Illness:   Nicole Le is a 32 y.o. [phone] female at [redacted]w[redacted]d with an Estimated Date of Delivery: 06/24/18 being seen today for ongoing management of a low-risk pregnancy.  Today she reports varicose vein Lt labia minora, doesn't hurt, has been there about 1 month. Not scheduled for PN2 today.  Contractions: Not present.  .  Movement: Present. denies leaking of fluid. Review of Systems:   Pertinent items are noted in HPI Denies abnormal vaginal discharge w/ itching/odor/irritation, headaches, visual changes, shortness of breath, chest pain, abdominal pain, severe nausea/vomiting, or problems with urination or bowel movements unless otherwise stated above. Pertinent History Reviewed:  Reviewed past medical,surgical, social, obstetrical and family history.  Reviewed problem list, medications and allergies. Physical Assessment:   Vitals:   04/03/18 1309  BP: 101/70  Pulse: 72  Weight: 188 lb 6.4 oz (85.5 kg)  Body mass index is 34.46 kg/m.        Physical Examination:   General appearance: Well appearing, and in no distress  Mental status: Alert, oriented to person, place, and time  Skin: Warm & dry  Cardiovascular: Normal heart rate noted  Respiratory: Normal respiratory effort, no distress  Abdomen: Soft, gravid, nontender  Pelvic: varicose vein Lt labia minora         Extremities: Edema: None  Fetal Status: Fetal Heart Rate (bpm): 120 Fundal Height: 29 cm Movement: Present    Results for orders placed or performed in visit on 04/03/18 (from the past 24 hour(s))  POC Urinalysis Dipstick OB   Collection Time: 04/03/18  1:16 PM  Result Value Ref Range   Color, UA     Clarity, UA     Glucose, UA Negative Negative   Bilirubin, UA     Ketones, UA small    Spec Grav,  UA     Blood, UA neg    pH, UA     POC Protein UA Negative Negative, Trace   Urobilinogen, UA     Nitrite, UA neg    Leukocytes, UA Negative Negative   Appearance     Odor      Assessment & Plan:  1) Low-risk pregnancy [id number] at [redacted]w[redacted]d with an Estimated Date of Delivery: 06/24/18   2) Vulvar varicose vein  3) Fetal prominent stomach/bubble @ 21wks, discussed w/ JVF, needs f/u u/s   Meds: No orders of the defined types were placed in this encounter.  Labs/procedures today: none, recommended tdap & flu, currently self pay so will go to [place]:  Continue routine obstetrical care   Reviewed: Preterm labor symptoms and general obstetric precautions including but not limited to vaginal bleeding, contractions, leaking of fluid and fetal movement were reviewed in detail with the patient.  All questions were answered  Follow-up: Return for asap PN2 (no visit), then 4wks for LROB and f/u u/s for stomach.  Orders Placed This Encounter  Procedures  . US OB Follow Up  . POC Urinalysis Dipstick OB   Roma Schanz CNM, [facility] 04/03/2018 1:44 PM

## 2018-04-03 NOTE — Patient Instructions (Addendum)
Nicole Le, I greatly value your feedback.  If you receive a survey following your visit with [place] today, we appreciate you taking the time to fill it out.  Thanks, Knute Neu, CNM, WHNP-BC  You will have your sugar test next visit.  Please do not eat or drink anything after midnight the night before you come, not even water.  You will be here for at least two hours.      Call the office [phone]) or go to [facility] if:  You begin to have strong, frequent contractions  Your water breaks.  Sometimes it is a big gush of fluid, sometimes it is just a trickle that keeps getting your panties wet or running down your legs  You have vaginal bleeding.  It is normal to have a small amount of spotting if your cervix was checked.   You don't feel your baby moving like normal.  If you don't, get you something to eat and drink and lay down and focus on feeling your baby move.  You should feel at least 10 movements in 2 hours.  If you don't, you should call the office or go to [facility].    Tdap Vaccine  It is recommended that you get the Tdap vaccine during the third trimester of EACH pregnancy to help protect your baby from getting pertussis (whooping cough)  27-36 weeks is the BEST time to do this so that you can pass the protection on to your baby. During pregnancy is better than after pregnancy, but if you are unable to get it during pregnancy it will be offered at the hospital.   You can get this vaccine with [place], at the health department, your family doctor, or some local pharmacies  Everyone who will be around your baby should also be up-to-date on their vaccines before the baby comes. Adults (who are not pregnant) only need 1 dose of Tdap during adulthood.    Tercer trimestre de Media planner (Third Trimester of Pregnancy) El tercer trimestre comprende desde la [id number] hasta la [id number], es decir, desde el mes7 hasta el mes9. En este trimestre, el feto crece muy  rpido. Hacia el final del noveno mes, el feto mide alrededor de 20pulgadas (45cm) de largo y pesa entre 6y 10libras [phone]). CUIDADOS EN EL HOGAR  No fume, no consuma hierbas ni beba alcohol. No tome frmacos que el mdico no haya autorizado.  No consuma ningn producto que contenga tabaco, lo que incluye cigarrillos, tabaco de Higher education careers adviser o Psychologist, sport and exercise. Si necesita ayuda para dejar de fumar, consulte al [employer]. Puede recibir asesoramiento u otro tipo de apoyo para dejar de fumar.  Tome los medicamentos solamente como se lo haya indicado el mdico. Algunos medicamentos son seguros para tomar durante el Media planner y otros no lo son.  Haga ejercicios solamente como se lo haya indicado el mdico. Interrumpa la actividad fsica si comienza a tener calambres.  Ingiera alimentos saludables de [place] regular.  Use un sostn que le brinde buen soporte si sus mamas estn sensibles.  No se d baos de inmersin en agua caliente, baos turcos ni saunas.  Colquese el cinturn de seguridad cuando conduzca.  No coma carne cruda ni queso sin cocinar; evite el contacto con las bandejas sanitarias de los gatos y la tierra que estos animales usan.  [place].  Tome entre 1500 y 2000mg  de calcio diariamente comenzando en la [id number] del embarazo [place].  Pruebe tomar un medicamento que la ayude  a defecar (un laxante suave) si el mdico lo autoriza. Consuma ms fibra, que se encuentra en las frutas y verduras frescas y los cereales integrales. Beba suficiente lquido para mantener el pis (orina) claro o de color amarillo plido.  Dese baos de asiento con agua tibia para Best boy o las molestias causadas por las hemorroides. Use una crema para las hemorroides si el mdico la autoriza.  Si se le hinchan las venas (venas varicosas), use medias de descanso. Levante (eleve) los pies durante 74minutos, 3 o 4veces por Training and development officer. Limite el consumo de sal en  su dieta.  No levante objetos pesados, use zapatos de tacones bajos y sintese derecha.  Descanse con las piernas elevadas si tiene calambres o dolor de cintura.  Visite a su dentista si no lo ha Quarry manager. Use un cepillo de cerdas suaves para cepillarse los dientes. Psese el hilo dental con suavidad.  Puede seguir [employer], a menos que el mdico le indique lo contrario.  No haga viajes de larga distancia, excepto si es obligatorio y solamente con la aprobacin del mdico.  Tome clases prenatales.  Practique ir manejando al hospital.  Prepare el bolso que llevar al hospital.  Prepare la habitacin del beb.  Concurra a los controles mdicos.  SOLICITE AYUDA SI:  No est segura de si est en trabajo de parto o si ha roto la bolsa de las aguas.  Tiene mareos.  Siente calambres leves o presin en la parte inferior del abdomen.  Sufre un dolor persistente en el abdomen.  Tiene Higher education careers adviser (nuseas), vmitos, o tiene deposiciones acuosas (diarrea).  Advierte un olor ftido que proviene de la vagina.  Siente dolor al [employer].  SOLICITE AYUDA DE INMEDIATO SI:  Tiene fiebre.  Tiene una prdida de lquido por la vagina.  Tiene sangrado o pequeas prdidas vaginales.  Siente dolor intenso o clicos en el abdomen.  Sube o baja de peso rpidamente.  Tiene dificultades para recuperar el aliento y siente dolor en el pecho.  Sbitamente se le hinchan mucho el rostro, las [place], los tobillos, los pies o las piernas.  No ha sentido los movimientos del beb durante Leone Brand.  Siente un dolor de cabeza intenso que no se alivia con medicamentos.  Su visin se modifica.  Esta informacin no tiene Marine scientist el consejo del mdico. Asegrese de hacerle al mdico cualquier pregunta que tenga. Document Released: 02/10/2013 Document Revised: 07/01/2014 Document Reviewed: 08/11/2012 Elsevier Interactive Patient Education   2017 [employer].

## 2018-04-10 ENCOUNTER — Other Ambulatory Visit: Payer: Self-pay

## 2018-04-10 DIAGNOSIS — Z3A29 29 weeks gestation of pregnancy: Secondary | ICD-10-CM

## 2018-04-10 DIAGNOSIS — Z131 Encounter for screening for diabetes mellitus: Secondary | ICD-10-CM

## 2018-04-10 DIAGNOSIS — Z3483 Encounter for supervision of other normal pregnancy, third trimester: Secondary | ICD-10-CM

## 2018-04-11 LAB — CBC
Hematocrit: 34.7 % (ref 34.0–46.6)
Hemoglobin: 12 g/dL (ref 11.1–15.9)
MCH: 32.2 pg (ref 26.6–33.0)
MCHC: 34.6 g/dL (ref 31.5–35.7)
MCV: 93 fL (ref 79–97)
PLATELETS: 169 10*3/uL (ref 150–450)
RBC: 3.73 x10E6/uL — ABNORMAL LOW (ref 3.77–5.28)
RDW: 12.3 % (ref 12.3–15.4)
WBC: 9.2 10*3/uL (ref 3.4–10.8)

## 2018-04-11 LAB — GLUCOSE TOLERANCE, 2 HOURS W/ 1HR
GLUCOSE, FASTING: 71 mg/dL (ref 65–91)
Glucose, 1 hour: 140 mg/dL (ref 65–179)
Glucose, 2 hour: 122 mg/dL (ref 65–152)

## 2018-04-11 LAB — ANTIBODY SCREEN: Antibody Screen: NEGATIVE

## 2018-04-11 LAB — HIV ANTIBODY (ROUTINE TESTING W REFLEX): HIV Screen 4th Generation wRfx: NONREACTIVE

## 2018-04-11 LAB — RPR: RPR Ser Ql: NONREACTIVE

## 2018-05-01 ENCOUNTER — Ambulatory Visit (INDEPENDENT_AMBULATORY_CARE_PROVIDER_SITE_OTHER): Payer: Self-pay

## 2018-05-01 ENCOUNTER — Encounter: Payer: Self-pay | Admitting: Women's Health

## 2018-05-01 ENCOUNTER — Other Ambulatory Visit: Payer: Self-pay

## 2018-05-01 ENCOUNTER — Ambulatory Visit (INDEPENDENT_AMBULATORY_CARE_PROVIDER_SITE_OTHER): Payer: Self-pay | Admitting: Women's Health

## 2018-05-01 VITALS — BP 106/59 | HR 73 | Wt 192.0 lb

## 2018-05-01 DIAGNOSIS — Z331 Pregnant state, incidental: Secondary | ICD-10-CM

## 2018-05-01 DIAGNOSIS — O26893 Other specified pregnancy related conditions, third trimester: Secondary | ICD-10-CM

## 2018-05-01 DIAGNOSIS — Z1389 Encounter for screening for other disorder: Secondary | ICD-10-CM

## 2018-05-01 DIAGNOSIS — R102 Pelvic and perineal pain: Secondary | ICD-10-CM

## 2018-05-01 DIAGNOSIS — N760 Acute vaginitis: Secondary | ICD-10-CM

## 2018-05-01 DIAGNOSIS — O283 Abnormal ultrasonic finding on antenatal screening of mother: Secondary | ICD-10-CM

## 2018-05-01 DIAGNOSIS — N949 Unspecified condition associated with female genital organs and menstrual cycle: Secondary | ICD-10-CM

## 2018-05-01 DIAGNOSIS — Z3A32 32 weeks gestation of pregnancy: Secondary | ICD-10-CM

## 2018-05-01 DIAGNOSIS — B9689 Other specified bacterial agents as the cause of diseases classified elsewhere: Secondary | ICD-10-CM

## 2018-05-01 DIAGNOSIS — Z3483 Encounter for supervision of other normal pregnancy, third trimester: Secondary | ICD-10-CM

## 2018-05-01 DIAGNOSIS — N898 Other specified noninflammatory disorders of vagina: Secondary | ICD-10-CM

## 2018-05-01 LAB — POCT WET PREP (WET MOUNT)
Clue Cells Wet Prep Whiff POC: POSITIVE
TRICHOMONAS WET PREP HPF POC: ABSENT

## 2018-05-01 LAB — POCT URINALYSIS DIPSTICK OB
Blood, UA: NEGATIVE
Glucose, UA: NEGATIVE
KETONES UA: NEGATIVE
Leukocytes, UA: NEGATIVE
NITRITE UA: NEGATIVE
PROTEIN: NEGATIVE

## 2018-05-01 MED ORDER — METRONIDAZOLE 500 MG PO TABS: 500.0000 mg | ORAL_TABLET | Freq: Two times a day (BID) | ORAL | 0 refills | Status: DC

## 2018-05-01 NOTE — Progress Notes (Signed)
[place] 23+5 wks,cephalic,anterior placenta gr 0,fhr 142 bpm,normal ovaries bilat,fhr 13 cm,EFW 2112 g 64%,stomach appears to be WNL

## 2018-05-01 NOTE — Progress Notes (Signed)
LOW-RISK PREGNANCY VISIT Patient name: Nicole Le MRN [id number]  Date of birth: [birth year] Chief Complaint:   Routine Prenatal Visit (u/s today)  History of Present Illness:   Nicole Le is a 32 y.o. [phone] female at [redacted]w[redacted]d with an Estimated Date of Delivery: 06/24/18 being seen today for ongoing management of a low-risk pregnancy.  Today she reports occ cramping. Some twinging in vagina, concerned about PTL. Small amt bleeding when pushing w/ bm the other day, none since. Is constipated, has increased water/fiber/fruits. Has not gotten flu or tdap shots yet- plans to call HD today to schedule. Contractions: Irregular. Vag. Bleeding: Scant.  Movement: Present. denies leaking of fluid. Review of Systems:   Pertinent items are noted in HPI Denies abnormal vaginal discharge w/ itching/odor/irritation, headaches, visual changes, shortness of breath, chest pain, abdominal pain, severe nausea/vomiting, or problems with urination or bowel movements unless otherwise stated above. Pertinent History Reviewed:  Reviewed past medical,surgical, social, obstetrical and family history.  Reviewed problem list, medications and allergies. Physical Assessment:   Vitals:   05/01/18 0926  BP: (!) 106/59  Pulse: 73  Weight: 192 lb (87.1 kg)  Body mass index is 35.12 kg/m.        Physical Examination:   General appearance: Well appearing, and in no distress  Mental status: Alert, oriented to person, place, and time  Skin: Warm & dry  Cardiovascular: Normal heart rate noted  Respiratory: Normal respiratory effort, no distress  Abdomen: Soft, gravid, nontender  Pelvic: spec exam: cx visually long/closed, mod amt thin white malodorous d/c         Extremities: Edema: Trace  Fetal Status: Fetal Heart Rate (bpm): 142 u/s Fundal Height: 32 cm Movement: Present    [place] 93+2 wks,cephalic,anterior placenta gr 0,fhr 142 bpm,normal ovaries bilat,fhr 13 cm,EFW 2112 g 64%,stomach appears  to be WNL     Results for orders placed or performed in visit on 05/01/18 (from the past 24 hour(s))  POC Urinalysis Dipstick OB   Collection Time: 05/01/18  9:26 AM  Result Value Ref Range   Color, UA     Clarity, UA     Glucose, UA Negative Negative   Bilirubin, UA     Ketones, UA neg    Spec Grav, UA     Blood, UA neg    pH, UA     POC,PROTEIN,UA Negative Negative, Trace   Urobilinogen, UA     Nitrite, UA neg    Leukocytes, UA Negative Negative   Appearance     Odor    POCT Wet Prep [facility])   Collection Time: 05/01/18  9:50 AM  Result Value Ref Range   Source Wet Prep POC vaginal    WBC, Wet Prep HPF POC few    Bacteria Wet Prep HPF POC Few Few   BACTERIA WET PREP MORPHOLOGY POC     Clue Cells Wet Prep HPF POC Many (A) None   Clue Cells Wet Prep Whiff POC Positive Whiff    Yeast Wet Prep HPF POC None None   KOH Wet Prep POC     Trichomonas Wet Prep HPF POC Absent Absent    Assessment & Plan:  1) Low-risk pregnancy [id number] at [redacted]w[redacted]d with an Estimated Date of Delivery: 06/24/18   2) BV, Rx metronidazole 500mg  BID x 7d for BV, no sex while taking   3) Resolved fetal stomach bubble   Meds:  Meds ordered this encounter  Medications  . metroNIDAZOLE (FLAGYL) 500 MG  tablet    Sig: Take 1 tablet (500 mg total) by mouth 2 (two) times daily.    Dispense:  14 tablet    Refill:  0    Order Specific Question:   Supervising Provider    Answer:   Tania Ade H [[id number]]   Labs/procedures today: spec exam, wet prep. Call HD today to get flu & tdap shots  Plan:  Continue routine obstetrical care   Reviewed: Preterm labor symptoms and general obstetric precautions including but not limited to vaginal bleeding, contractions, leaking of fluid and fetal movement were reviewed in detail with the patient.  All questions were answered  Follow-up: Return in about 2 weeks (around 05/15/2018) for [place].  Orders Placed This Encounter  Procedures  . POC Urinalysis Dipstick OB    . POCT Wet Prep [facility] [place])   Roma Schanz CNM, [facility] 05/01/2018 9:55 AM

## 2018-05-01 NOTE — Patient Instructions (Addendum)
Nicole Le, I greatly value your feedback.  If you receive a survey following your visit with [place] today, we appreciate you taking the time to fill it out.  Thanks, Knute Neu, CNM, WHNP-BC   Call the office [phone]) or go to [facility] if:  You begin to have strong, frequent contractions  Your water breaks.  Sometimes it is a big gush of fluid, sometimes it is just a trickle that keeps getting your panties wet or running down your legs  You have vaginal bleeding.  It is normal to have a small amount of spotting if your cervix was checked.   You don't feel your baby moving like normal.  If you don't, get you something to eat and drink and lay down and focus on feeling your baby move.  You should feel at least 10 movements in 2 hours.  If you don't, you should call the office or go to [facility] [address].    Tdap Vaccine  It is recommended that you get the Tdap vaccine during the third trimester of EACH pregnancy to help protect your baby from getting pertussis (whooping cough)  27-36 weeks is the BEST time to do this so that you can pass the protection on to your baby. During pregnancy is better than after pregnancy, but if you are unable to get it during pregnancy it will be offered at the hospital.   You can get this vaccine with [place], at the health department, your family doctor, or some local pharmacies  Everyone who will be around your baby should also be up-to-date on their vaccines before the baby comes. Adults (who are not pregnant) only need 1 dose of Tdap during adulthood.   Constipation  Drink plenty of fluid, preferably water, throughout the day  Eat foods high in fiber such as fruits, vegetables, and grains  Exercise, such as walking, is a good way to keep your bowels regular  Drink warm fluids, especially warm prune juice, or decaf coffee  Eat a 1/2 cup of real oatmeal (not instant), 1/2 cup applesauce, and 1/2-1 cup warm prune juice every day  If  needed, you may take Colace (docusate sodium) stool softener once or twice a day to help keep the stool soft. If you are pregnant, wait until you are out of your first trimester (12-14 weeks of pregnancy)  If you still are having problems with constipation, you may take Miralax once daily as needed to help keep your bowels regular.  If you are pregnant, wait until you are out of your first trimester (12-14 weeks of pregnancy)      Tercer trimestre de Media planner (Third Trimester of Pregnancy) El tercer trimestre comprende desde la semana29 hasta la semana42, es decir, desde el mes7 hasta el mes9. En este trimestre, el feto crece muy rpido. Hacia el final del noveno mes, el feto mide alrededor de 20pulgadas (45cm) de largo y pesa entre 6y 10libras [phone]). CUIDADOS EN EL HOGAR  No fume, no consuma hierbas ni beba alcohol. No tome frmacos que el mdico no haya autorizado.  No consuma ningn producto que contenga tabaco, lo que incluye cigarrillos, tabaco de Higher education careers adviser o Psychologist, sport and exercise. Si necesita ayuda para dejar de fumar, consulte al [employer]. Puede recibir asesoramiento u otro tipo de apoyo para dejar de fumar.  Tome los medicamentos solamente como se lo haya indicado el mdico. Algunos medicamentos son seguros para tomar durante el Media planner y otros no lo son.  Haga ejercicios solamente como se lo  haya indicado el mdico. Interrumpa la actividad fsica si comienza a tener calambres.  Ingiera alimentos saludables de [place] regular.  Use un sostn que le brinde buen soporte si sus mamas estn sensibles.  No se d baos de inmersin en agua caliente, baos turcos ni saunas.  Colquese el cinturn de seguridad cuando conduzca.  No coma carne cruda ni queso sin cocinar; evite el contacto con las bandejas sanitarias de los gatos y la tierra que estos animales usan.  [place].  Tome entre 1500 y 2000mg  de calcio diariamente comenzando en la  SWFUXN23 del embarazo [place].  Pruebe tomar un medicamento que la ayude a defecar (un laxante suave) si el mdico lo autoriza. Consuma ms fibra, que se encuentra en las frutas y verduras frescas y los cereales integrales. Beba suficiente lquido para mantener el pis (orina) claro o de color amarillo plido.  Dese baos de asiento con agua tibia para Best boy o las molestias causadas por las hemorroides. Use una crema para las hemorroides si el mdico la autoriza.  Si se le hinchan las venas (venas varicosas), use medias de descanso. Levante (eleve) los pies durante 80minutos, 3 o 4veces por Training and development officer. Limite el consumo de sal en su dieta.  No levante objetos pesados, use zapatos de tacones bajos y sintese derecha.  Descanse con las piernas elevadas si tiene calambres o dolor de cintura.  Visite a su dentista si no lo ha Quarry manager. Use un cepillo de cerdas suaves para cepillarse los dientes. Psese el hilo dental con suavidad.  Puede seguir [employer], a menos que el mdico le indique lo contrario.  No haga viajes de larga distancia, excepto si es obligatorio y solamente con la aprobacin del mdico.  Tome clases prenatales.  Practique ir manejando al hospital.  Prepare el bolso que llevar al hospital.  Prepare la habitacin del beb.  Concurra a los controles mdicos.  SOLICITE AYUDA SI:  No est segura de si est en trabajo de parto o si ha roto la bolsa de las aguas.  Tiene mareos.  Siente calambres leves o presin en la parte inferior del abdomen.  Sufre un dolor persistente en el abdomen.  Tiene Higher education careers adviser (nuseas), vmitos, o tiene deposiciones acuosas (diarrea).  Advierte un olor ftido que proviene de la vagina.  Siente dolor al [employer].  SOLICITE AYUDA DE INMEDIATO SI:  Tiene fiebre.  Tiene una prdida de lquido por la vagina.  Tiene sangrado o pequeas prdidas vaginales.  Siente dolor intenso o  clicos en el abdomen.  Sube o baja de peso rpidamente.  Tiene dificultades para recuperar el aliento y siente dolor en el pecho.  Sbitamente se le hinchan mucho el rostro, las [place], los tobillos, los pies o las piernas.  No ha sentido los movimientos del beb durante [employer] Brand.  Siente un dolor de cabeza intenso que no se alivia con medicamentos.  Su visin se modifica.  Esta informacin no tiene Marine scientist el consejo del mdico. Asegrese de hacerle al mdico cualquier pregunta que tenga. Document Released: 02/10/2013 Document Revised: 07/01/2014 Document Reviewed: 08/11/2012 Elsevier Interactive Patient Education  2017 [employer].

## 2018-05-15 ENCOUNTER — Encounter (INDEPENDENT_AMBULATORY_CARE_PROVIDER_SITE_OTHER): Payer: Self-pay

## 2018-05-15 ENCOUNTER — Ambulatory Visit (INDEPENDENT_AMBULATORY_CARE_PROVIDER_SITE_OTHER): Payer: Self-pay | Admitting: Women's Health

## 2018-05-15 ENCOUNTER — Encounter: Payer: Self-pay | Admitting: Women's Health

## 2018-05-15 VITALS — BP 113/73 | HR 81 | Wt 194.8 lb

## 2018-05-15 DIAGNOSIS — Z1389 Encounter for screening for other disorder: Secondary | ICD-10-CM

## 2018-05-15 DIAGNOSIS — Z3483 Encounter for supervision of other normal pregnancy, third trimester: Secondary | ICD-10-CM

## 2018-05-15 DIAGNOSIS — Z331 Pregnant state, incidental: Secondary | ICD-10-CM

## 2018-05-15 DIAGNOSIS — Z3A34 34 weeks gestation of pregnancy: Secondary | ICD-10-CM

## 2018-05-15 LAB — POCT URINALYSIS DIPSTICK OB
Blood, UA: NEGATIVE
Glucose, UA: NEGATIVE
Ketones, UA: NEGATIVE
LEUKOCYTES UA: NEGATIVE
Nitrite, UA: NEGATIVE
POC,PROTEIN,UA: NEGATIVE

## 2018-05-15 NOTE — Patient Instructions (Signed)
Nicole Le, I greatly value your feedback.  If you receive a survey following your visit with [place] today, we appreciate you taking the time to fill it out.  Thanks, Knute Neu, CNM, WHNP-BC   Call the office [phone]) or go to [facility] if:  You begin to have strong, frequent contractions  Your water breaks.  Sometimes it is a big gush of fluid, sometimes it is just a trickle that keeps getting your panties wet or running down your legs  You have vaginal bleeding.  It is normal to have a small amount of spotting if your cervix was checked.   You don't feel your baby moving like normal.  If you don't, get you something to eat and drink and lay down and focus on feeling your baby move.  You should feel at least 10 movements in 2 hours.  If you don't, you should call the office or go to [place] y [place] prematuros Preterm Labor and Birth Information El embarazo tiene generalmente una duracin de 39 a 41 semanas. El [place] de parto es prematuro cuando se inicia muy pronto. Comienza antes de completar las 37 semanas de [place]. Cules son los factores de riesgo del [place] prematuro? Existen mayores probabilidades de trabajo de parto prematuro en mujeres con las siguientes caractersticas:  Tuvieron una infeccin Solicitor.  El cuello uterino es corto.  Tuvieron trabajo de parto prematuro anteriormente.  Se sometieron a una ciruga en el cuello uterino.  Son menores de 17aos.  Tienen ms de 35aos.  Son afroamericanas.  Estn embarazadas de dos o ms bebs.  Consumen drogas mientras estn embarazadas.  Fuman mientras estn embarazadas.  No aumentan de peso lo suficiente durante el [employer].  Se embarazaron inmediatamente despus de Psychologist, clinical.  Cules son los sntomas del [place] prematuro? Los sntomas del trabajo de parto prematuro incluyen lo siguiente:  Marketing executive.  Los calambres pueden parecerse a los que tiene una mujer durante el perodo menstrual. Los calambres pueden presentarse con diarrea.  Dolor de vientre (abdomen).  Dolor en la zona lumbar.  Tiene contracciones regulares o endurecimiento del tero. Siente como si el vientre se endurece.  Presin en la zona inferior del vientre que Futures trader.  Pierde ms lquido (secrecin) por la vagina. El lquido puede ser acuoso o con [place].  Ruptura de la bolsa de aguas.  Por qu es importante notar los signos del [place] prematuro? Los bebs que nacen antes de tiempo pueden no estar completamente desarrollados. Estos pueden tener un riesgo mayor de padecer:  Problemas cardacos a Barrister's clerk.  Problemas pulmonares a Barrister's clerk.  Dificultades para controlar los sistemas corporales, por ejemplo, respirar.  Hemorragia cerebral.  Una afeccin que se denomina parlisis cerebral.  Dificultades en el aprendizaje.  [place].  Estos riesgos son [employer] para bebs que nacen antes de las 34semanas de [place]. Cmo se trata [place] de parto prematuro? El tratamiento depende de lo siguiente:  El tiempo de [place].  Su estado de [place].  La salud del beb.  El tratamiento puede incluir lo siguiente:  Un punto (sutura) en el cuello uterino. Al parir, el cuello uterino se abre para que el beb pueda salir. El punto impide que el cuello uterino se abra antes de [place].  Permanecer en el hospital.  Tomar medicamentos como, por ejemplo: ? Medicamentos hormonales. ? Medicamentos para Scientist, water quality las contracciones. ? Medicamentos para  ayudar a la maduracin de los pulmones del beb. ? Medicamentos para evitar que el beb desarrolle parlisis cerebral.  Qu debo hacer si estoy en [place] prematuro? Si cree que est en trabajo de parto demasiado pronto, llame a su mdico de inmediato. Cmo puedo prevenir el trabajo de parto prematuro?  No use productos que  contengan tabaco. ? Estos incluyen cigarrillos, tabaco para Higher education careers adviser y Psychologist, sport and exercise. ? Si necesita ayuda para dejar de fumar, consulte al mdico.  No consuma drogas.  No tome ningn medicamento si el mdico no se lo indic.  Consulte al mdico antes de empezar a tomar cualquier suplemento de hierbas.  Asegrese aumentar de peso como corresponde.  Tenga cuidado con las infecciones. Si cree que puede tener una infeccin, consulte al mdico para que la revisen inmediatamente.  Infrmele al mdico si ha tenido trabajo de parto prematuro anteriormente. Esta informacin no tiene Marine scientist el consejo del mdico. Asegrese de hacerle al mdico cualquier pregunta que tenga. Document Released: 07/13/2010 Document Revised: 09/18/2016 Document Reviewed: 11/01/2015 Elsevier Interactive Patient Education  [employer].

## 2018-05-15 NOTE — Progress Notes (Signed)
   LOW-RISK PREGNANCY VISIT Patient name: Nicole Le MRN [id number]  Date of birth: 1986/02/18 Chief Complaint:   Routine Prenatal Visit  History of Present Illness:   Nicole Le is a 32 y.o. [phone] female at [redacted]w[redacted]d with an Estimated Date of Delivery: 06/24/18 being seen today for ongoing management of a low-risk pregnancy.  Today she reports no complaints. Got flu & tdap shots after last visit at [facility]. Contractions: Irregular.  .  Movement: Present. denies leaking of fluid. Review of Systems:   Pertinent items are noted in HPI Denies abnormal vaginal discharge w/ itching/odor/irritation, headaches, visual changes, shortness of breath, chest pain, abdominal pain, severe nausea/vomiting, or problems with urination or bowel movements unless otherwise stated above. Pertinent History Reviewed:  Reviewed past medical,surgical, social, obstetrical and family history.  Reviewed problem list, medications and allergies. Physical Assessment:   Vitals:   05/15/18 0854  BP: 113/73  Pulse: 81  Weight: 194 lb 12.8 oz (88.4 kg)  Body mass index is 35.63 kg/m.        Physical Examination:   General appearance: Well appearing, and in no distress  Mental status: Alert, oriented to person, place, and time  Skin: Warm & dry  Cardiovascular: Normal heart rate noted  Respiratory: Normal respiratory effort, no distress  Abdomen: Soft, gravid, nontender  Pelvic: Cervical exam deferred         Extremities: Edema: None  Fetal Status: Fetal Heart Rate (bpm): 140 Fundal Height: 34 cm Movement: Present    Results for orders placed or performed in visit on 05/15/18 (from the past 24 hour(s))  POC Urinalysis Dipstick OB   Collection Time: 05/15/18  8:56 AM  Result Value Ref Range   Color, UA     Clarity, UA     Glucose, UA Negative Negative   Bilirubin, UA     Ketones, UA neg    Spec Grav, UA     Blood, UA neg    pH, UA     POC,PROTEIN,UA Negative Negative, Trace, Small  (1+), Moderate (2+), Large (3+), 4+   Urobilinogen, UA     Nitrite, UA neg    Leukocytes, UA Negative Negative   Appearance     Odor      Assessment & Plan:  1) Low-risk pregnancy [id number] at [redacted]w[redacted]d with an Estimated Date of Delivery: 06/24/18    Meds: No orders of the defined types were placed in this encounter.  Labs/procedures today: none  Plan:  Continue routine obstetrical care   Reviewed: Preterm labor symptoms and general obstetric precautions including but not limited to vaginal bleeding, contractions, leaking of fluid and fetal movement were reviewed in detail with the patient.  All questions were answered  Follow-up: Return in about 2 weeks (around 05/29/2018) for [place].  Orders Placed This Encounter  Procedures  . POC Urinalysis Dipstick OB   Roma Schanz CNM, [facility] 05/15/2018 10:01 AM

## 2018-05-29 ENCOUNTER — Encounter (INDEPENDENT_AMBULATORY_CARE_PROVIDER_SITE_OTHER): Payer: Self-pay

## 2018-05-29 ENCOUNTER — Ambulatory Visit (INDEPENDENT_AMBULATORY_CARE_PROVIDER_SITE_OTHER): Payer: Self-pay | Admitting: Obstetrics and Gynecology

## 2018-05-29 ENCOUNTER — Encounter: Payer: Self-pay | Admitting: Obstetrics and Gynecology

## 2018-05-29 VITALS — BP 113/73 | HR 76 | Wt 200.8 lb

## 2018-05-29 DIAGNOSIS — Z331 Pregnant state, incidental: Secondary | ICD-10-CM

## 2018-05-29 DIAGNOSIS — Z3A36 36 weeks gestation of pregnancy: Secondary | ICD-10-CM

## 2018-05-29 DIAGNOSIS — Z1389 Encounter for screening for other disorder: Secondary | ICD-10-CM

## 2018-05-29 DIAGNOSIS — Z3483 Encounter for supervision of other normal pregnancy, third trimester: Secondary | ICD-10-CM

## 2018-05-29 LAB — POCT URINALYSIS DIPSTICK OB
KETONES UA: NEGATIVE
LEUKOCYTES UA: NEGATIVE
NITRITE UA: NEGATIVE
PROTEIN: NEGATIVE

## 2018-05-29 NOTE — Progress Notes (Signed)
Patient ID: Nicole Le, female   DOB: August 01, 1985, 32 y.o.   MRN: [id number]    LOW-RISK PREGNANCY VISIT Patient name: Nicole Le MRN [id number]  Date of birth: April 20, 1986 Chief Complaint:   Routine Prenatal Visit  History of Present Illness:   Nicole Le is a 32 y.o. [id number] female at [redacted]w[redacted]d with an Estimated Date of Delivery: 06/24/18 being seen today for ongoing management of a low-risk pregnancy. Is accompanied by husband and Optometrist. She has contractions but they don't cause her much pain. She has a lot of pressure but it doesn't hurt Today she reports no complaints. Contractions: Irregular. Vag. Bleeding: None.  Movement: Present. denies leaking of fluid. Review of Systems:   Pertinent items are noted in HPI Denies abnormal vaginal discharge w/ itching/odor/irritation, headaches, visual changes, shortness of breath, chest pain, abdominal pain, severe nausea/vomiting, or problems with urination or bowel movements unless otherwise stated above. Pertinent History Reviewed:  Reviewed past medical,surgical, social, obstetrical and family history.  Reviewed problem list, medications and allergies. Physical Assessment:   Vitals:   05/29/18 0929  BP: 113/73  Pulse: 76  Weight: 200 lb 12.8 oz (91.1 kg)  Body mass index is 36.73 kg/m.        Physical Examination:   General appearance: Well appearing, and in no distress  Mental status: Alert, oriented to person, place, and time  Skin: Warm & dry  Cardiovascular: Normal heart rate noted  Respiratory: Normal respiratory effort, no distress  Abdomen: Soft, gravid, nontender  Pelvic: Cervical exam deferred         Extremities: Edema: Trace  Fetal Status: Fetal Heart Rate (bpm): 147 Fundal Height: 38 cm Movement: Present    Results for orders placed or performed in visit on 05/29/18 (from the past 24 hour(s))  POC Urinalysis Dipstick OB   Collection Time: 05/29/18  9:31 AM  Result Value Ref  Range   Color, UA     Clarity, UA     Glucose, UA Trace (A) Negative   Bilirubin, UA     Ketones, UA neg    Spec Grav, UA     Blood, UA trace    pH, UA     POC,PROTEIN,UA Negative Negative, Trace, Small (1+), Moderate (2+), Large (3+), 4+   Urobilinogen, UA     Nitrite, UA neg    Leukocytes, UA Negative Negative   Appearance     Odor      Assessment & Plan:  1) Low-risk pregnancy [id number] at [redacted]w[redacted]d with an Estimated Date of Delivery: 06/24/18     Meds: No orders of the defined types were placed in this encounter.  Labs/procedures today: none  Plan:   F/u in 1 week for gbs, gc/chl   Follow-up: Return in about 1 week (around 06/05/2018) for gbs, gc/chl.  Orders Placed This Encounter  Procedures  . POC Urinalysis Dipstick OB   By signing my name below, I, Samul Dada, attest that this documentation has been prepared under the direction and in the presence of Jonnie Kind, MD. Electronically Signed: [place]. 05/29/18. 10:05 AM.  I personally performed the services described in this documentation, which was SCRIBED in my presence. The recorded information has been reviewed and considered accurate. It has been edited as necessary during review. Jonnie Kind, MD

## 2018-06-05 ENCOUNTER — Ambulatory Visit (INDEPENDENT_AMBULATORY_CARE_PROVIDER_SITE_OTHER): Payer: Self-pay | Admitting: Obstetrics & Gynecology

## 2018-06-05 ENCOUNTER — Encounter: Payer: Self-pay | Admitting: Obstetrics & Gynecology

## 2018-06-05 VITALS — BP 115/73 | HR 69 | Wt 201.5 lb

## 2018-06-05 DIAGNOSIS — Z3483 Encounter for supervision of other normal pregnancy, third trimester: Secondary | ICD-10-CM

## 2018-06-05 DIAGNOSIS — Z331 Pregnant state, incidental: Secondary | ICD-10-CM

## 2018-06-05 DIAGNOSIS — O3660X1 Maternal care for excessive fetal growth, unspecified trimester, fetus 1: Secondary | ICD-10-CM

## 2018-06-05 DIAGNOSIS — Z3A37 37 weeks gestation of pregnancy: Secondary | ICD-10-CM

## 2018-06-05 DIAGNOSIS — Z1389 Encounter for screening for other disorder: Secondary | ICD-10-CM

## 2018-06-05 LAB — POCT URINALYSIS DIPSTICK OB
GLUCOSE, UA: NEGATIVE
Ketones, UA: NEGATIVE
LEUKOCYTES UA: NEGATIVE
Nitrite, UA: NEGATIVE
POC,PROTEIN,UA: NEGATIVE

## 2018-06-05 NOTE — Progress Notes (Signed)
   LOW-RISK PREGNANCY VISIT Patient name: Nicole Le MRN [id number]  Date of birth: 05-11-1986 Chief Complaint:   Routine Prenatal Visit (GBS, GC/CHL; felt faint yesterday)  History of Present Illness:   Nicole Le is a 32 y.o. [phone] female at [redacted]w[redacted]d with an Estimated Date of Delivery: 06/24/18 being seen today for ongoing management of a low-risk pregnancy.  Today she reports no complaints. Contractions: Irregular. Vag. Bleeding: None.  Movement: Present. denies leaking of fluid. Review of Systems:   Pertinent items are noted in HPI Denies abnormal vaginal discharge w/ itching/odor/irritation, headaches, visual changes, shortness of breath, chest pain, abdominal pain, severe nausea/vomiting, or problems with urination or bowel movements unless otherwise stated above. Pertinent History Reviewed:  Reviewed past medical,surgical, social, obstetrical and family history.  Reviewed problem list, medications and allergies. Physical Assessment:   Vitals:   06/05/18 1232  BP: 115/73  Pulse: 69  Weight: 201 lb 8 oz (91.4 kg)  Body mass index is 36.85 kg/m.        Physical Examination:   General appearance: Well appearing, and in no distress  Mental status: Alert, oriented to person, place, and time  Skin: Warm & dry  Cardiovascular: Normal heart rate noted  Respiratory: Normal respiratory effort, no distress  Abdomen: Soft, gravid, nontender  Pelvic: Cervical exam performed       LTC  Extremities: Edema: None  Fetal Status: Fetal Heart Rate (bpm): 142 Fundal Height: 40 cm Movement: Present    Results for orders placed or performed in visit on 06/05/18 (from the past 24 hour(s))  POC Urinalysis Dipstick OB   Collection Time: 06/05/18 12:33 PM  Result Value Ref Range   Color, UA     Clarity, UA     Glucose, UA Negative Negative   Bilirubin, UA     Ketones, UA neg    Spec Grav, UA     Blood, UA trace    pH, UA     POC,PROTEIN,UA Negative Negative,  Trace, Small (1+), Moderate (2+), Large (3+), 4+   Urobilinogen, UA     Nitrite, UA neg    Leukocytes, UA Negative Negative   Appearance     Odor      Assessment & Plan:  1) Low-risk pregnancy [id number] at [redacted]w[redacted]d with an Estimated Date of Delivery: 06/24/18   2) LGA, check EFW   Meds: No orders of the defined types were placed in this encounter.  Labs/procedures today: cultures done today  Plan:  Continue routine obstetrical care sonogram next week  Reviewed: Term labor symptoms and general obstetric precautions including but not limited to vaginal bleeding, contractions, leaking of fluid and fetal movement were reviewed in detail with the patient.  All questions were answered  Follow-up: No follow-ups on file.  Orders Placed This Encounter  Procedures  . GC/Chlamydia Probe Amp  . Strep Gp B NAA  . US OB Follow Up  . POC Urinalysis Dipstick OB   Florian Buff  06/05/2018 12:55 PM

## 2018-06-07 LAB — STREP GP B NAA: Strep Gp B NAA: NEGATIVE

## 2018-06-09 LAB — GC/CHLAMYDIA PROBE AMP
CHLAMYDIA, DNA PROBE: NEGATIVE
NEISSERIA GONORRHOEAE BY PCR: NEGATIVE

## 2018-06-10 ENCOUNTER — Ambulatory Visit (INDEPENDENT_AMBULATORY_CARE_PROVIDER_SITE_OTHER): Payer: Self-pay | Admitting: Obstetrics and Gynecology

## 2018-06-10 ENCOUNTER — Ambulatory Visit (INDEPENDENT_AMBULATORY_CARE_PROVIDER_SITE_OTHER): Payer: Self-pay

## 2018-06-10 ENCOUNTER — Encounter: Payer: Self-pay | Admitting: Obstetrics and Gynecology

## 2018-06-10 VITALS — BP 122/73 | HR 78 | Wt 204.6 lb

## 2018-06-10 DIAGNOSIS — Z3483 Encounter for supervision of other normal pregnancy, third trimester: Secondary | ICD-10-CM

## 2018-06-10 DIAGNOSIS — O3660X1 Maternal care for excessive fetal growth, unspecified trimester, fetus 1: Secondary | ICD-10-CM

## 2018-06-10 DIAGNOSIS — Z331 Pregnant state, incidental: Secondary | ICD-10-CM

## 2018-06-10 DIAGNOSIS — Z1389 Encounter for screening for other disorder: Secondary | ICD-10-CM

## 2018-06-10 DIAGNOSIS — Z3A38 38 weeks gestation of pregnancy: Secondary | ICD-10-CM

## 2018-06-10 LAB — POCT URINALYSIS DIPSTICK OB
GLUCOSE, UA: NEGATIVE
Ketones, UA: NEGATIVE
LEUKOCYTES UA: NEGATIVE
Nitrite, UA: NEGATIVE
POC,PROTEIN,UA: NEGATIVE

## 2018-06-10 NOTE — Progress Notes (Signed)
[place] 38 wks,cephalic,anterior placenta gr 2,bilat adnexa's wnl,afi 21 cm,fhr 148 bpm,EFW 4045 g 97%

## 2018-06-10 NOTE — Progress Notes (Signed)
Patient ID: Nicole Le, female   DOB: 1986/03/20, 32 y.o.   MRN: [id number]    LOW-RISK PREGNANCY VISIT Patient name: Nicole Le MRN [id number]  Date of birth: 12/08/1985 Chief Complaint:   Routine Prenatal Visit (vaginal discharge; + contractions)  History of Present Illness:   Nicole Le is a 32 y.o. [phone] female at [redacted]w[redacted]d with an Estimated Date of Delivery: 06/24/18 being seen today for ongoing management of a low-risk pregnancy.  Today she reports some brownish vaginal discharge. Contractions: Regular. Vag. Bleeding: Small.  Movement: Present. denies leaking of fluid. Review of Systems:   Pertinent items are noted in HPI Denies abnormal vaginal discharge w/ itching/odor/irritation, headaches, visual changes, shortness of breath, chest pain, abdominal pain, severe nausea/vomiting, or problems with urination or bowel movements unless otherwise stated above. Pertinent History Reviewed:  Reviewed past medical,surgical, social, obstetrical and family history.  Reviewed problem list, medications and allergies. Physical Assessment:   Vitals:   06/10/18 1528  BP: 122/73  Pulse: 78  Weight: 204 lb 9.6 oz (92.8 kg)  Body mass index is 37.42 kg/m.        Physical Examination:   General appearance: Well appearing, and in no distress  Mental status: Alert, oriented to person, place, and time  Skin: Warm & dry  Cardiovascular: Normal heart rate noted  Respiratory: Normal respiratory effort, no distress  Abdomen: Soft, gravid, nontender  Pelvic: Cervical exam performed ballotable, 1 cm        Extremities: Edema: Trace  Fetal Status: Fetal Heart Rate (bpm): 148 u/s Fundal Height: 43 cm Movement: Present    Results for orders placed or performed in visit on 06/10/18 (from the past 24 hour(s))  POC Urinalysis Dipstick OB   Collection Time: 06/10/18  3:23 PM  Result Value Ref Range   Color, UA     Clarity, UA     Glucose, UA Negative Negative   Bilirubin, UA     Ketones, UA neg    Spec Grav, UA     Blood, UA trace    pH, UA     POC,PROTEIN,UA Negative Negative, Trace, Small (1+), Moderate (2+), Large (3+), 4+   Urobilinogen, UA     Nitrite, UA neg    Leukocytes, UA Negative Negative   Appearance     Odor      Assessment & Plan:  1) Low-risk pregnancy [id number] at [redacted]w[redacted]d with an Estimated Date of Delivery: 06/24/18     Meds: No orders of the defined types were placed in this encounter.  Labs/procedures today: [place] 38 wks,cephalic,anterior placenta gr 2,bilat adnexa's wnl,afi 21 cm,fhr 148 bpm,EFW 4045 g 97%  Plan:  F/u for routine LROB 1 week  Reviewed: Term labor symptoms and general obstetric precautions including but not limited to vaginal bleeding, contractions, leaking of fluid and fetal movement were reviewed in detail with the patient.  All questions were answered  Follow-up: Return in about 1 week (around 06/17/2018) for [place].  Orders Placed This Encounter  Procedures  . POC Urinalysis Dipstick OB   By signing my name below, I, Samul Dada, attest that this documentation has been prepared under the direction and in the presence of Jonnie Kind, MD. Electronically Signed: [place]. 06/10/18. 3:59 PM.  I personally performed the services described in this documentation, which was SCRIBED in my presence. The recorded information has been reviewed and considered accurate. It has been edited as necessary during review. Jonnie Kind, MD

## 2018-06-10 NOTE — Progress Notes (Signed)
Brown vaginal discharge.

## 2018-06-11 ENCOUNTER — Inpatient Hospital Stay (HOSPITAL_COMMUNITY)
Admission: AD | Admit: 2018-06-11 | Discharge: 2018-06-13 | DRG: 807 | Disposition: A | Discharge status: Home / Self Care | Payer: Medicaid Other | Attending: Obstetrics and Gynecology | Admitting: Obstetrics and Gynecology

## 2018-06-11 ENCOUNTER — Encounter (HOSPITAL_COMMUNITY): Payer: Self-pay | Admitting: *Deleted

## 2018-06-11 DIAGNOSIS — O3663X0 Maternal care for excessive fetal growth, third trimester, not applicable or unspecified: Secondary | ICD-10-CM | POA: Diagnosis present

## 2018-06-11 DIAGNOSIS — Z3483 Encounter for supervision of other normal pregnancy, third trimester: Secondary | ICD-10-CM | POA: Diagnosis present

## 2018-06-11 DIAGNOSIS — Z3A38 38 weeks gestation of pregnancy: Secondary | ICD-10-CM

## 2018-06-11 HISTORY — DX: Other specified health status: Z78.9

## 2018-06-11 LAB — CBC
HCT: 35.7 % — ABNORMAL LOW (ref 36.0–46.0)
Hemoglobin: 12.5 g/dL (ref 12.0–15.0)
MCH: 32.6 pg (ref 26.0–34.0)
MCHC: 35 g/dL (ref 30.0–36.0)
MCV: 93.2 fL (ref 80.0–100.0)
Platelets: 196 10*3/uL (ref 150–400)
RBC: 3.83 MIL/uL — AB (ref 3.87–5.11)
RDW: 12.8 % (ref 11.5–15.5)
WBC: 12.1 10*3/uL — ABNORMAL HIGH (ref 4.0–10.5)
nRBC: 0 % (ref 0.0–0.2)

## 2018-06-11 LAB — ABO/RH: ABO/RH(D): O POS

## 2018-06-11 LAB — TYPE AND SCREEN
ABO/RH(D): O POS
Antibody Screen: NEGATIVE

## 2018-06-11 MED ORDER — IBUPROFEN 600 MG PO TABS
600.0000 mg | ORAL_TABLET | Freq: Four times a day (QID) | ORAL | Status: DC
  Administered 2018-06-11 – 2018-06-13 (×8): 600 mg via ORAL
  Filled 2018-06-11 (×8): qty 1

## 2018-06-11 MED ORDER — ACETAMINOPHEN 325 MG PO TABS: 650.0000 mg | ORAL_TABLET | ORAL | Status: DC | PRN

## 2018-06-11 MED ORDER — OXYTOCIN 40 UNITS IN LACTATED RINGERS INFUSION - SIMPLE MED
2.5000 [IU]/h | INTRAVENOUS | Status: DC
  Administered 2018-06-11: 2.5 [IU]/h via INTRAVENOUS
  Filled 2018-06-11: qty 1000

## 2018-06-11 MED ORDER — ACETAMINOPHEN 325 MG PO TABS
650.0000 mg | ORAL_TABLET | ORAL | Status: DC | PRN
  Administered 2018-06-11 – 2018-06-12 (×2): 650 mg via ORAL
  Filled 2018-06-11 (×2): qty 2

## 2018-06-11 MED ORDER — DIPHENHYDRAMINE HCL 25 MG PO CAPS: 25.0000 mg | ORAL_CAPSULE | Freq: Four times a day (QID) | ORAL | Status: DC | PRN

## 2018-06-11 MED ORDER — WITCH HAZEL-GLYCERIN EX PADS: 1.0000 "application " | MEDICATED_PAD | CUTANEOUS | Status: DC | PRN

## 2018-06-11 MED ORDER — SOD CITRATE-CITRIC ACID 500-334 MG/5ML PO SOLN: 30.0000 mL | ORAL | Status: DC | PRN

## 2018-06-11 MED ORDER — SENNOSIDES-DOCUSATE SODIUM 8.6-50 MG PO TABS
2.0000 | ORAL_TABLET | ORAL | Status: DC
  Administered 2018-06-12 – 2018-06-13 (×2): 2 via ORAL
  Filled 2018-06-11 (×2): qty 2

## 2018-06-11 MED ORDER — LACTATED RINGERS IV SOLN
INTRAVENOUS | Status: DC
  Administered 2018-06-11: 11:00:00 via INTRAVENOUS

## 2018-06-11 MED ORDER — TETANUS-DIPHTH-ACELL PERTUSSIS 5-2.5-18.5 LF-MCG/0.5 IM SUSP: 0.5000 mL | Freq: Once | INTRAMUSCULAR | Status: DC

## 2018-06-11 MED ORDER — OXYTOCIN BOLUS FROM INFUSION
500.0000 mL | Freq: Once | INTRAVENOUS | Status: AC
  Administered 2018-06-11: 500 mL via INTRAVENOUS

## 2018-06-11 MED ORDER — OXYCODONE-ACETAMINOPHEN 5-325 MG PO TABS: 2.0000 | ORAL_TABLET | ORAL | Status: DC | PRN

## 2018-06-11 MED ORDER — FLEET ENEMA 7-19 GM/118ML RE ENEM: 1.0000 | ENEMA | RECTAL | Status: DC | PRN

## 2018-06-11 MED ORDER — ONDANSETRON HCL 4 MG PO TABS: 4.0000 mg | ORAL_TABLET | ORAL | Status: DC | PRN

## 2018-06-11 MED ORDER — SIMETHICONE 80 MG PO CHEW: 80.0000 mg | CHEWABLE_TABLET | ORAL | Status: DC | PRN

## 2018-06-11 MED ORDER — ZOLPIDEM TARTRATE 5 MG PO TABS: 5.0000 mg | ORAL_TABLET | Freq: Every evening | ORAL | Status: DC | PRN

## 2018-06-11 MED ORDER — ONDANSETRON HCL 4 MG/2ML IJ SOLN: 4.0000 mg | INTRAMUSCULAR | Status: DC | PRN

## 2018-06-11 MED ORDER — BENZOCAINE-MENTHOL 20-0.5 % EX AERO: 1.0000 "application " | INHALATION_SPRAY | CUTANEOUS | Status: DC | PRN

## 2018-06-11 MED ORDER — LACTATED RINGERS IV SOLN: 500.0000 mL | INTRAVENOUS | Status: DC | PRN

## 2018-06-11 MED ORDER — LIDOCAINE HCL (PF) 1 % IJ SOLN
30.0000 mL | INTRAMUSCULAR | Status: AC | PRN
  Administered 2018-06-11: 30 mL via SUBCUTANEOUS
  Filled 2018-06-11: qty 30

## 2018-06-11 MED ORDER — DIBUCAINE 1 % RE OINT: 1.0000 "application " | TOPICAL_OINTMENT | RECTAL | Status: DC | PRN

## 2018-06-11 MED ORDER — OXYCODONE-ACETAMINOPHEN 5-325 MG PO TABS: 1.0000 | ORAL_TABLET | ORAL | Status: DC | PRN

## 2018-06-11 MED ORDER — ONDANSETRON HCL 4 MG/2ML IJ SOLN: 4.0000 mg | Freq: Four times a day (QID) | INTRAMUSCULAR | Status: DC | PRN

## 2018-06-11 MED ORDER — PRENATAL MULTIVITAMIN CH
1.0000 | ORAL_TABLET | Freq: Every day | ORAL | Status: DC
  Administered 2018-06-12 – 2018-06-13 (×2): 1 via ORAL
  Filled 2018-06-11 (×2): qty 1

## 2018-06-11 MED ORDER — COCONUT OIL OIL: 1.0000 "application " | TOPICAL_OIL | Status: DC | PRN

## 2018-06-11 NOTE — H&P (Signed)
Nicole Le is a 32 y.o. female [phone] @[redacted]w[redacted]d  pt of [facility] presenting for active labor at term.  Pregnancy has been uncomplicated.  EFW 4045 @ 38 weeks.  Largest previous baby 7.5 lbs per pt.   Nursing Staff Provider  Office Location [facility]  Dating  1st trimester U/S 6wk    Anatomy US Large stomach bubble, repeat: normal  Initiated care at [facility]  LAB RESULTS   Language  Spanish Pap 08/2017 neg [facility] neg  Support Person  [place] GC/CT Initial: -/-         37wks:    Genetics NT/IT: NEGATIVE     AFP:        NIPS:  Flu Vaccine  Nov 2019 @ [facility] [place]/HgbE   TDaP vaccine  Nov 2019 @ [facility]   CF neg  Rhogam   SMA     Blood Type  O+  Feeding Plan both Antibody  neg  Contraception discussed Rubella  imm  Circumcision n/a RPR  neg  Pediatrician  [place] HBsAg  neg  Prenatal Classes declined HIV  neg    A1C/GTT Early:          26-28wks: 71/140/122  BTL Consent  GBS     VBAC Consent n/a Waterbirth [ ]  Class [ ]  Consent [ ]  CNM visit    OB History    Gravida  5   Para  3   Term  3   Preterm      AB  1   Living  3     SAB  1   TAB      Ectopic      Multiple  0   Live Births  3          Past Medical History:  Diagnosis Date  . Medical history non-contributory   . No pertinent past medical history    Past Surgical History:  Procedure Laterality Date  . DILATION AND CURETTAGE OF UTERUS  02/13/2012   Procedure: DILATATION AND CURETTAGE;  Surgeon: Jonnie Kind, MD;  Location: [facility];  Service: Gynecology;  Laterality: N/A;  Suction    Family History: family history includes Diabetes in her brother and mother; Hypertension in her maternal uncle, mother, paternal uncle, and sister. Social History:  reports that she has never smoked. She has never used smokeless tobacco. She reports that she does not drink alcohol or use drugs.     Maternal Diabetes: No Genetic Screening: Normal Maternal Ultrasounds/Referrals: Normal Fetal Ultrasounds or other  Referrals:  None Maternal Substance Abuse:  No Significant Maternal Medications:  None Significant Maternal Lab Results:  Lab values include: Group B Strep negative Other Comments:  None  ROS Maternal Medical History:  Reason for admission: Contractions.   Contractions: Onset was 3-5 hours ago.   Frequency: regular.   Perceived severity is strong.    Fetal activity: Perceived fetal activity is normal.   Last perceived fetal movement was within the past hour.    Prenatal complications: no prenatal complications Prenatal Complications - Diabetes: none.    Dilation: 4 Effacement (%): 100 Exam by:: F. Morris, RNC Blood pressure 129/74, pulse 77, temperature 97.8 F (36.6 C), temperature source Oral, resp. rate 18, height 5\' 4"  (1.626 m), weight 93 kg, last menstrual period 09/10/2017, unknown if currently breastfeeding. Maternal Exam:  Uterine Assessment: Contraction strength is moderate.  Contraction frequency is regular.   Abdomen: Estimated fetal weight is 4045g @ 38 weeks.   Fetal presentation: vertex  Cervix: Cervix evaluated by digital exam.     Fetal Exam Fetal Monitor Review: Mode: ultrasound.   Baseline rate: 145.  Variability: moderate (6-25 bpm).   Pattern: accelerations present and no decelerations.    Fetal State Assessment: Category I - tracings are normal.     Physical Exam  Nursing note and vitals reviewed. Constitutional: She is oriented to person, place, and time. She appears well-developed and well-nourished.  Neck: Normal range of motion.  Cardiovascular: Normal rate.  Respiratory: Effort normal.  GI: Soft.  Musculoskeletal: Normal range of motion.  Neurological: She is alert and oriented to person, place, and time.  Skin: Skin is warm and dry.  Psychiatric: She has a normal mood and affect. Her behavior is normal. Judgment and thought content normal.    Prenatal labs: ABO, Rh: O/Positive/-- (06/03 1228) Antibody: Negative (10/18  0848) Rubella: 19.60 (06/03 1228) RPR: Non Reactive (10/18 0848)  HBsAg: Negative (06/03 1228)  HIV: Non Reactive (10/18 0848)  GBS: Negative (12/13 1330)   Assessment/Plan: 33 y.o. P1G9842 at [redacted]w[redacted]d admitted for active labor at term GBS negative LGA, EFW 97%tile at 38 weeks  Admit to [facility] May have epidural if desired, pt prefers unmedicated labor Follow labor progress closely due to [place] 06/11/2018, 11:11 AM

## 2018-06-11 NOTE — MAU Note (Signed)
Pt had leaking of fluid @ 0600, is not still leaking now.  Also began having contractions @ 0600, every 15-20 minutes.  Saw some bleeding on toilet tissue this a.m.  Fetal movement is decreased this morning.

## 2018-06-11 NOTE — Plan of Care (Signed)
  Problem: Education: Goal: Knowledge of General Education information will improve Description Including pain rating scale, medication(s)/side effects and non-pharmacologic comfort measures Note:  Admission education, safety and unit protocols reviewed with patient and significant other using stratus video interpreter "Antonio" # [id number]. Patient and significant other have no questions at this time. Maxwell Caul, Leretha Dykes [place]

## 2018-06-11 NOTE — Progress Notes (Signed)
Colman Cater, CNM @ bedside, U/S performed by CNM, fetus vertex.

## 2018-06-12 LAB — RPR: RPR Ser Ql: NONREACTIVE

## 2018-06-12 NOTE — Progress Notes (Signed)
Parent request formula to supplement breast feeding due to parents verbalizing that MOB had been breastfeeding for "over an hour" and MOB was "tired". Parents have been educated about cluster feeding. Parents have been informed of small tummy size of newborn, taught hand expression and understands the possible consequences of formula to the health of the infant. The possible consequences shared with patent include 1) Loss of confidence in breastfeeding 2) Engorgement 3) Allergic sensitization of baby(asthema/allergies) and 4) decreased milk supply for mother.After discussion of the above the mother decided to supplement with Nicole Le. The  tool used to give formula supplement will be bottle. Parents have been given handouts in Spanish about supplementation and formula prep.

## 2018-06-12 NOTE — Progress Notes (Addendum)
POSTPARTUM PROGRESS NOTE  Subjective:  Nicole Le is a 32 y.o. O2H4765 [redacted]w[redacted]d s/p SVD complicated by mild shoulder dystocia.  No acute events overnight.  Pt denies problems with ambulating, voiding or po intake.  She denies nausea or vomiting.  Pain is well controlled.  She has had flatus. She has not had bowel movement.  Lochia Moderate.   Objective: Blood pressure (!) 103/58, pulse 62, temperature 98.2 F (36.8 C), temperature source Oral, resp. rate 15, height 5\' 4"  (1.626 m), weight 93 kg, last menstrual period 09/10/2017, SpO2 99 %, unknown if currently breastfeeding.  Physical Exam:  General: alert, cooperative and no distress Lochia:normal flow Chest: no respiratory distress Heart:regular rate, distal pulses intact Abdomen: soft, nontender,  Uterine Fundus: firm, appropriately tender DVT Evaluation: No calf swelling or tenderness Extremities: no edema  Recent Labs    06/11/18 1036  HGB 12.5  HCT 35.7*    Assessment/Plan:  ASSESSMENT: Nicole Le is a 32 y.o. Y6T0354 [redacted]w[redacted]d s/p SVD complicated by mild shoulder dystocia. -no concerns today -still undecided about postpartum contraception -plans to breast feed, and has successfully breast fed other children. Supplementing with formula until milk comes in  Plan for discharge tomorrow   LOS: 1 day   Maizie Garno H WilsonMD 06/12/2018, 7:31 AM

## 2018-06-12 NOTE — Progress Notes (Signed)
Ordered meals and check on her needs, by [place].

## 2018-06-12 NOTE — Lactation Note (Signed)
This note was copied from a baby's chart. Lactation Consultation Note  Patient Name: Nicole Le [id number] Date: 06/12/2018 Reason for consult: Initial assessment;Early term 39-38.6wks  Visited with P4 Mom of ET infant.  Mom asleep and FOB holding baby.  FOB stated baby is breastfeeding well.  Lactation brochure given.   LC to return later this afternoon.   Consult Status Consult Status: Follow-up Date: 06/12/18 Follow-up type: In-patient    Broadus John 06/12/2018, 1:53 PM

## 2018-06-13 DIAGNOSIS — Z3A38 38 weeks gestation of pregnancy: Secondary | ICD-10-CM

## 2018-06-13 MED ORDER — IBUPROFEN 600 MG PO TABS: 600.0000 mg | ORAL_TABLET | Freq: Four times a day (QID) | ORAL | 0 refills | Status: DC

## 2018-06-13 MED ORDER — SENNOSIDES-DOCUSATE SODIUM 8.6-50 MG PO TABS: 2.0000 | ORAL_TABLET | ORAL | 1 refills | Status: DC

## 2018-06-13 MED ORDER — SIMETHICONE 80 MG PO CHEW: 80.0000 mg | CHEWABLE_TABLET | ORAL | 0 refills | Status: DC | PRN

## 2018-06-13 MED ORDER — ACETAMINOPHEN 325 MG PO TABS: 650.0000 mg | ORAL_TABLET | ORAL | 0 refills | Status: DC | PRN

## 2018-06-13 MED ORDER — TETANUS-DIPHTH-ACELL PERTUSSIS 5-2.5-18.5 LF-MCG/0.5 IM SUSP: 0.5000 mL | Freq: Once | INTRAMUSCULAR | 0 refills | Status: AC

## 2018-06-13 NOTE — Discharge Summary (Addendum)
Postpartum Discharge Summary     Patient Name: Nicole Le DOB: 09-01-1985 MRN: [id number]  Date of admission: 06/11/2018 Delivering Provider: Gavin Pound   Date of discharge: 06/13/2018  Admitting diagnosis: 38wks, water broke, CTX Intrauterine pregnancy: [redacted]w[redacted]d     Secondary diagnosis:  Active Problems:   Normal labor and delivery   SVD (spontaneous vaginal delivery)   Shoulder dystocia during labor and delivery  Additional problems: none     Discharge diagnosis: Term Pregnancy Delivered                                                                                                Post partum procedures: none  Augmentation: none  Complications: None  Hospital course:  Onset of Labor With Vaginal Delivery     32 y.o. yo [id number] at [redacted]w[redacted]d was admitted in Active Labor on 06/11/2018. Patient had an uncomplicated labor course as follows:  Membrane Rupture Time/Date: 12:00 PM ,06/11/2018   Intrapartum Procedures: Episiotomy: None [1]                                         Lacerations:  2nd degree [3]  Patient had a delivery of a Viable infant. 06/11/2018  Information for the patient's newborn:  Nona Gracey, Girl Juda [[id number]]  Delivery Method: Vaginal, Spontaneous(Filed from Delivery Summary)    Pateint had an uncomplicated postpartum course.  She is ambulating, tolerating a regular diet, passing flatus, and urinating well. Patient is discharged home in stable condition on 06/13/18.   Magnesium Sulfate recieved: No BMZ received: No  Physical exam  Vitals:   06/12/18 0335 06/12/18 1407 06/12/18 2334 06/13/18 0554  BP: (!) 103/58 115/64 115/65 94/66  Pulse: 62 66 (!) 57 67  Resp: 15 16 18 15   Temp: 98.2 F (36.8 C) 98.1 F (36.7 C) 98.3 F (36.8 C) 98 F (36.7 C)  TempSrc: Oral  Oral Oral  SpO2:    100%  Weight:      Height:       General: alert Lochia: appropriate Uterine Fundus: firm Incision: N/A DVT Evaluation: No evidence of  DVT seen on physical exam. Labs: Lab Results  Component Value Date   WBC 12.1 (H) 06/11/2018   HGB 12.5 06/11/2018   HCT 35.7 (L) 06/11/2018   MCV 93.2 06/11/2018   PLT 196 06/11/2018   CMP Latest Ref Rng & Units 01/14/2018  Glucose 70 - 99 mg/dL 83  BUN 6 - 20 mg/dL 5(L)  Creatinine 0.44 - 1.00 mg/dL 0.52  Sodium 135 - 145 mmol/L 136  Potassium 3.5 - 5.1 mmol/L 3.7  Chloride 98 - 111 mmol/L 106  CO2 22 - 32 mmol/L 21(L)  Calcium 8.9 - 10.3 mg/dL 8.9  Total Protein 6.5 - 8.1 g/dL 6.3(L)  Total Bilirubin 0.3 - 1.2 mg/dL 0.4  Alkaline Phos 38 - 126 U/L 45  AST 15 - 41 U/L 19  ALT 0 - 44 U/L 18    Discharge instruction: per After Visit Summary and "  Baby and Me Booklet".  After visit meds:  Allergies as of 06/13/2018   No Known Allergies     Medication List    TAKE these medications   acetaminophen 325 MG tablet Commonly known as:  TYLENOL Take 2 tablets (650 mg total) by mouth every 4 (four) hours as needed (for pain scale < 4). What changed:    medication strength  how much to take  when to take this  reasons to take this   ibuprofen 600 MG tablet Commonly known as:  ADVIL,MOTRIN Take 1 tablet (600 mg total) by mouth every 6 (six) hours.   prenatal vitamin w/FE, FA 27-1 MG Tabs tablet Take 1 tablet by mouth daily at 12 noon.   senna-docusate 8.6-50 MG tablet Commonly known as:  Senokot-S Take 2 tablets by mouth daily. Start taking on:  June 14, 2018   simethicone 80 MG chewable tablet Commonly known as:  MYLICON Chew 1 tablet (80 mg total) by mouth as needed for flatulence.   Tdap 5-2.5-18.5 LF-MCG/0.5 injection Commonly known as:  BOOSTRIX Inject 0.5 mLs into the muscle once for 1 dose.       Diet: routine diet  Activity: Advance as tolerated. Pelvic rest for 6 weeks.   Outpatient follow up:4 weeks Follow up Appt: Future Appointments  Date Time Provider [place]  07/16/2018  2:00 PM Cresenzo-Dishmon, Joaquim Lai, CNM [facility]  [facility]   Follow up Visit:   Please schedule this patient for Postpartum visit in: 4 weeks with the following provider: Any provider For C/S patients schedule nurse incision check in weeks 2 weeks: NA Low risk pregnancy complicated by: NA Delivery mode:  SVD with mild shoulder dystocia Anticipated Birth Control:  other/unsure PP Procedures needed: NA  Schedule [place] visit: no      Newborn Data: Live born female  Birth Weight: 7 lb 14.4 oz (3583 g) APGAR: 9, 9  Newborn Delivery   Time head delivered:  06/11/2018 12:13:00 Birth date/time:  06/11/2018 12:14:00 Delivery type:  Vaginal, Spontaneous     Baby Feeding: breast with formula supplementation Disposition:home with mother   06/13/2018 Aura Camps, MD  OB FELLOW DISCHARGE ATTESTATION  I have seen and examined this patient and agree with above documentation in the resident's note.   Aura Camps, MD  OB Fellow  06/13/2018, 3:03 PM

## 2018-06-13 NOTE — Lactation Note (Signed)
This note was copied from a baby's chart. Lactation Consultation Note  Patient Name: Girl Geniece Akers [id number] Date: 06/13/2018 Reason for consult: Follow-up assessment;Early term 37-38.6wks P4, 40 hour female infant. Mom's current breastfeeding choice is breastfeeding and supplementing with formula.  Per parents, infant had 7 voids and 8 stools. Per mom she breastfeed her previous children and the 3rd child she breastfeed for 6 months. Per mom, infant is latching well now she last breast at 3 am for 30 minutes. LC unable observe latch Mom breastfeed infant 1hr 30 minutes  prior to [facility] entering the room. Mom has no breastfeeding concerns at this time. Mom will call LC as prn.  Maternal Data Formula Feeding for Exclusion: No  Feeding    LATCH Score                   Interventions    Lactation Tools Discussed/Used     Consult Status Consult Status: Follow-up Date: 06/14/18 Follow-up type: In-patient    Vicente Serene 06/13/2018, 4:28 AM

## 2018-06-13 NOTE — Discharge Instructions (Signed)
Parto vaginal, cuidados posteriores °Vaginal Delivery, Care After °Siga estas instrucciones durante las próximas semanas. Estas indicaciones le proporcionan información acerca de cómo deberá cuidarse después del parto vaginal. Su médico también podrá darle indicaciones más específicas. El tratamiento ha sido planificado según las prácticas médicas actuales, pero en algunos casos pueden ocurrir problemas. Llame al médico si tiene problemas o preguntas. °¿Qué puedo esperar después del procedimiento? °Después de un parto vaginal, es frecuente tener lo siguiente: °· Hemorragia leve de la vagina. °· Dolor en el abdomen, la vagina y la zona de la piel entre la abertura vaginal y el ano (perineo). °· Calambres pélvicos. °· Fatiga. °Siga estas indicaciones en su casa: °Medicamentos °· Tome los medicamentos de venta libre y los recetados solamente como se lo haya indicado el médico. °· Si le recetaron un antibiótico, tómelo como se lo haya indicado el médico. No interrumpa la administración del antibiótico hasta que lo haya terminado. °Conducir ° °· No conduzca ni opere maquinaria pesada mientras toma analgésicos recetados. °· No conduzca durante 24 horas si le administraron un sedante. °Estilo de vida °· No beba alcohol. Esto es de suma importancia si está amamantando o toma analgésicos. °· No consuma productos que contengan tabaco, incluidos cigarrillos, tabaco de mascar o cigarrillos electrónicos. Si necesita ayuda para dejar de fumar, consulte al médico. °Qué debe comer y beber °· Beba al menos 8 vasos de ocho onzas (240 cc) de agua todos los días a menos que el médico le indique lo contrario. Si elige amamantar al bebé, quizá deba beber aún más cantidad de agua. °· Coma alimentos ricos en fibras todos los días. Estos alimentos pueden ayudarla a prevenir o aliviar el estreñimiento. Los alimentos ricos en fibras incluyen, entre otros: °? Panes y cereales integrales. °? Arroz integral. °? Frijoles. °? Frutas y verduras  frescas. °Actividad °· Retome sus actividades normales como se lo haya indicado el médico. Pregúntele al médico qué actividades son seguras para usted. °· Descanse todo lo que pueda. Trate de descansar o tomar una siesta mientras el bebé está durmiendo. °· No levante objetos que pesen más que su bebé o 10 libras (4,5 kg) hasta que el médico le diga que es seguro. °· Hable con el médico sobre cuándo puede retomar la actividad sexual. Esto puede depender de lo siguiente: °? Riesgo de sufrir una infección. °? Velocidad de cicatrización. °? Comodidad y deseo de retomar la actividad sexual. °Cuidados vaginales °· Si le realizaron una episiotomía o tuvo un desgarro vaginal, contrólese la zona todos los días para detectar signos de infección. Esté atenta a los siguientes signos: °? Aumento del enrojecimiento, la hinchazón o el dolor. °? Mayor presencia de líquido o sangre. °? Calor. °? Pus o mal olor. °· No use tampones ni se haga duchas vaginales hasta que el médico la autorice. °· Controle la sangre que elimina por la vagina para detectar coágulos de sangre. Estos pueden tener el aspecto de grumos de color rojo oscuro, o secreción marrón o negra. °Instrucciones generales °· Mantenga el perineo limpio y seco, como se lo haya indicado el médico. °· Use ropa cómoda y suelta. °· Cuando vaya al baño, siempre higienícese de adelante hacia atrás. °· Pregúntele al médico si puede ducharse o tomar baños de inmersión. Si se le realizó una episiotomía o tuvo un desgarro perineal durante el trabajo del parto o el parto, es posible que el médico le indique que no tome baños de inmersión durante un determinado tiempo. °· Use un sostén que sujete y ajuste bien sus pechos. °· Si   es posible, pídale a alguien que la ayude con las tareas del hogar y a cuidar del bebé durante al menos algunos días después de que le den el alta del hospital. °· Concurra a todas las visitas de seguimiento para usted y el bebé, como se lo haya indicado el  médico. Esto es importante. °Comuníquese con un médico si: °· Tiene los siguientes síntomas: °? Secreción vaginal que tiene mal olor. °? Dificultad para orinar. °? Dolor al orinar. °? Aumento o disminución repentinos de la frecuencia de las deposiciones. °? Más enrojecimiento, hinchazón o dolor alrededor de la episiotomía o del desgarro vaginal. °? Más secreción de líquido o sangre de la episiotomía o del desgarro vaginal. °? Pus o mal olor proveniente de la episiotomía o del desgarro vaginal. °? Fiebre. °? Erupción cutánea. °? Poco interés o falta de interés en actividades que solían gustarle. °? Dudas sobre su cuidado y el del bebé. °· Siente la episiotomía o el desgarro vaginal caliente al tacto. °· La episiotomía o el desgarro vaginal se abren o no parecen cicatrizar. °· Siente dolor en las mamas, o están duras o enrojecidas. °· Siente tristeza o preocupación de forma inusual. °· Siente náuseas o vomita. °· Elimina coágulos de sangre grandes por la vagina. Si expulsa un coágulo de sangre por la vagina, guárdelo para mostrárselo a su médico. No tire la cadena sin que el médico examine el coágulo de sangre antes. °· Orina más de lo habitual. °· Se siente mareada o se desmaya. °· No ha amamantado para nada y no ha tenido un período menstrual durante 12 semanas después del parto. °· Dejó de amamantar al bebé y no ha tenido su período menstrual durante 12 semanas después de dejar de amamantar. °Solicite ayuda de inmediato si: °· Tiene los siguientes síntomas: °? Dolor que no desaparece o no mejora con medicamentos. °? Dolor en el pecho. °? Dificultad para respirar. °? Visión borrosa o manchas en la vista. °? Pensamientos de autolesionarse o lesionar al bebé. °· Comienza a sentir dolor en el abdomen o en una de las piernas. °· Presenta un dolor de cabeza intenso. °· Se desmaya. °· Tiene una hemorragia de la vagina tan intensa que empapa dos toallitas sanitarias en una hora. °Esta información no tiene como fin  reemplazar el consejo del médico. Asegúrese de hacerle al médico cualquier pregunta que tenga. °Document Released: 06/10/2005 Document Revised: 10/02/2016 Document Reviewed: 06/25/2015 °Elsevier Interactive Patient Education © 2019 Elsevier Inc. ° °

## 2018-06-18 ENCOUNTER — Encounter: Payer: Self-pay | Admitting: Women's Health

## 2018-07-16 ENCOUNTER — Ambulatory Visit: Payer: Self-pay | Admitting: Advanced Practice Midwife

## 2018-07-22 ENCOUNTER — Ambulatory Visit (INDEPENDENT_AMBULATORY_CARE_PROVIDER_SITE_OTHER): Payer: Self-pay | Admitting: Advanced Practice Midwife

## 2018-07-22 ENCOUNTER — Encounter: Payer: Self-pay | Admitting: Advanced Practice Midwife

## 2018-07-22 VITALS — BP 125/75 | HR 72 | Ht 64.0 in | Wt 183.0 lb

## 2018-07-22 DIAGNOSIS — R300 Dysuria: Secondary | ICD-10-CM

## 2018-07-22 LAB — POCT URINALYSIS DIPSTICK
Blood, UA: NEGATIVE
Glucose, UA: NEGATIVE
KETONES UA: NEGATIVE
Nitrite, UA: NEGATIVE
Protein, UA: NEGATIVE

## 2018-07-22 NOTE — Patient Instructions (Signed)
Estreimiento, en adultos  Constipation, Adult  Estreimiento significa que una persona defeca en una semana menos que lo normal, tiene dificultad para defecar, o las heces son secas, duras, o ms grandes que lo normal. El estreimiento podra estar provocado por una enfermedad preexistente. Puede empeorar con la edad si una persona toma ciertos medicamentos y no toma suficiente lquido.  Siga estas indicaciones en su casa:  Qu debe comer y beber     Consuma alimentos con alto contenido de fibra, como frutas y verduras frescas, cereales integrales y frijoles.   Limite los alimentos ricos en grasas y con bajo contenido de fibra, o muy procesados, como las papas fritas, hamburguesas, galletas, dulces y refrescos.   Beba suficiente lquido para mantener la orina clara o de color amarillo plido.  Instrucciones generales   Haga actividad fsica habitualmente o como se lo haya indicado el mdico.   Vaya al bao cuando sienta la necesidad de ir. No se aguante las ganas.   Tome los medicamentos de venta libre y los recetados solamente como se lo haya indicado el mdico. Estos incluyen los suplementos de fibra.   Practique ejercicios de rehabilitacin del suelo plvico, como la respiracin profunda mientras relaja la parte inferior del abdomen y la relajacin del suelo plvico mientras defeca.   Controle su afeccin para detectar cualquier cambio.   Concurra a todas las visitas de seguimiento como se lo haya indicado el mdico. Esto es importante.  Comunquese con un mdico si:   Su dolor empeora.   Tiene fiebre.   No defeca despus de 4das.   Vomita.   No tiene hambre.   Pierde peso.   Tiene una hemorragia en el ano.   Las heces son delgadas como un lpiz.  Solicite ayuda de inmediato si:   Tiene fiebre y los sntomas empeoran repentinamente.   Observa que se filtran heces o hay sangre en las heces.   Tiene el abdomen distendido.   Siente un dolor intenso en el abdomen.   Se siente mareado o se  desmaya.  Esta informacin no tiene como fin reemplazar el consejo del mdico. Asegrese de hacerle al mdico cualquier pregunta que tenga.  Document Released: 06/30/2007 Document Revised: 09/30/2016 Document Reviewed: 11/29/2015  Elsevier Interactive Patient Education  2019 Elsevier Inc.

## 2018-07-22 NOTE — Progress Notes (Signed)
Nicole Le is a 33 y.o. who presents for a postpartum visit. She is 6 weeks postpartum following a spontaneous vaginal delivery. I have fully reviewed the prenatal and intrapartum course. The delivery was at 38.1 gestational weeks, mild SD.  Anesthesia: Local. Postpartum course has been uneventful. Baby's course has been uneventful. Baby is feeding by breast. Bleeding: no bleeding. Bowel function is normal. Bladder function is normal. Patient is not sexually active. Contraception method is abstinence. Postpartum depression screening: negative.  Has some burning w/urination (at laceration )  And constipation.   Tips given.   Current Outpatient Medications:  .  acetaminophen (TYLENOL) 325 MG tablet, Take 2 tablets (650 mg total) by mouth every 4 (four) hours as needed (for pain scale < 4)., Disp: 30 tablet, Rfl: 0 .  prenatal vitamin w/FE, FA (PRENATAL 1 + 1) 27-1 MG TABS tablet, Take 1 tablet by mouth daily at 12 noon., Disp: 30 each, Rfl: 12 .  senna-docusate (SENOKOT-S) 8.6-50 MG tablet, Take 2 tablets by mouth daily., Disp: 60 tablet, Rfl: 1 .  simethicone (MYLICON) 80 MG chewable tablet, Chew 1 tablet (80 mg total) by mouth as needed for flatulence., Disp: 30 tablet, Rfl: 0  Review of Systems   Constitutional: Negative for fever and chills Eyes: Negative for visual disturbances Respiratory: Negative for shortness of breath, dyspnea Cardiovascular: Negative for chest pain or palpitations  Gastrointestinal: Negative for vomiting, diarrhea and constipation Genitourinary: Negative for frequency/urgency Musculoskeletal: Negative for back pain, joint pain, myalgias  Neurological: Negative for dizziness and headaches    Objective:     Vitals:   07/22/18 1209  BP: 125/75  Pulse: 72   General:  alert, cooperative and no distress   Breasts:  negative  Lungs: Normal respiratory effort  Heart:  regular rate and rhythm  Abdomen: Soft, nontender   Vulva:  normal  Vagina: normal  vagina.  TIny spot on perineum still raw, but superficial and already smaller than photo taken 4 days ago  Cervix:  closed  Corpus: Well involuted     Rectal Exam: no hemorrhoids        Assessment:    normal postpartum exam.  Plan:   1. Contraception: condoms/vasectomy 2. Follow up as needed.  No sex for 2 weeks; if still not healed, send photo via mychart for eval (self pay)

## 2019-05-11 ENCOUNTER — Other Ambulatory Visit: Payer: Self-pay

## 2019-05-11 DIAGNOSIS — Z20822 Contact with and (suspected) exposure to covid-19: Secondary | ICD-10-CM

## 2019-05-12 LAB — NOVEL CORONAVIRUS, NAA: SARS-CoV-2, NAA: NOT DETECTED

## 2020-02-25 ENCOUNTER — Other Ambulatory Visit: Payer: Self-pay

## 2020-02-25 DIAGNOSIS — Z20822 Contact with and (suspected) exposure to covid-19: Secondary | ICD-10-CM

## 2020-02-26 LAB — NOVEL CORONAVIRUS, NAA: SARS-CoV-2, NAA: NOT DETECTED

## 2020-03-23 IMAGING — US US ABDOMEN LIMITED
1 series · 14 of 25 positions shown · non-contrast
Comparison: None.

CLINICAL DATA: Initial evaluation for acute right-sided abdominal
pain.

EXAM:
ULTRASOUND ABDOMEN LIMITED RIGHT UPPER QUADRANT

[Series 1: us abdomen limited · 0.23mm/px · 14 of 29 slices shown]
[im 1/29]
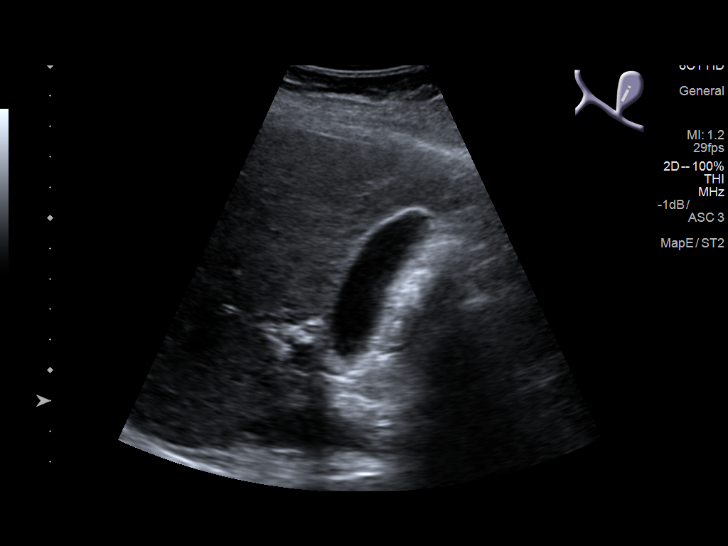
[im 3/29]
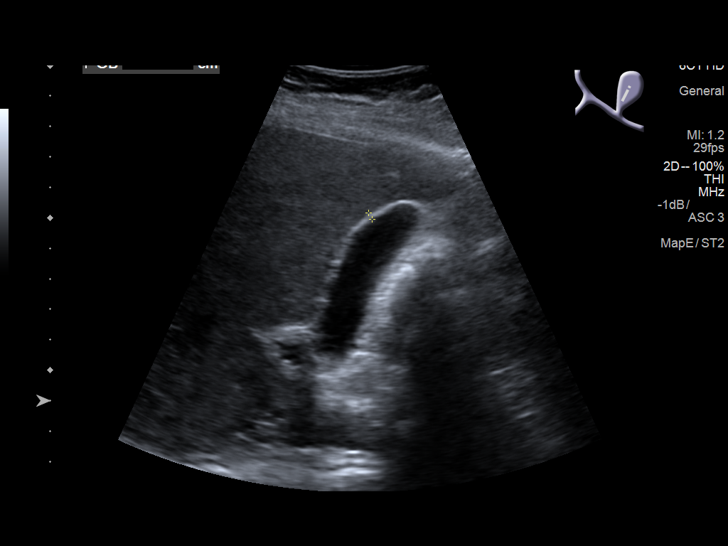
[im 5/29]
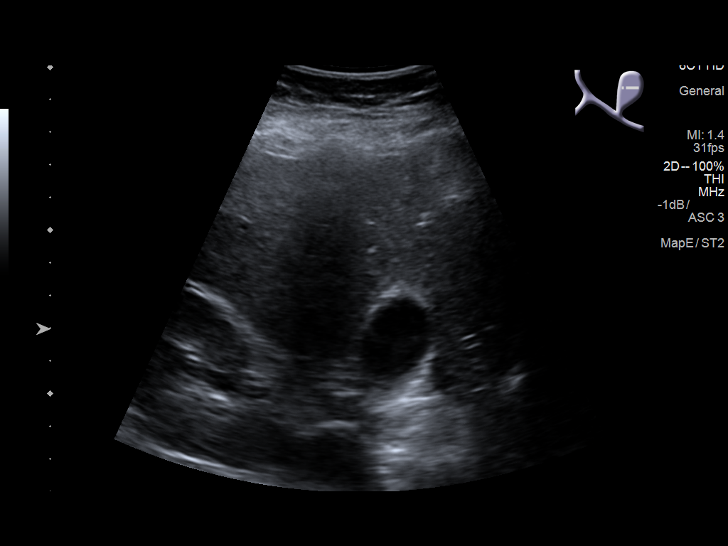
[im 8/29]
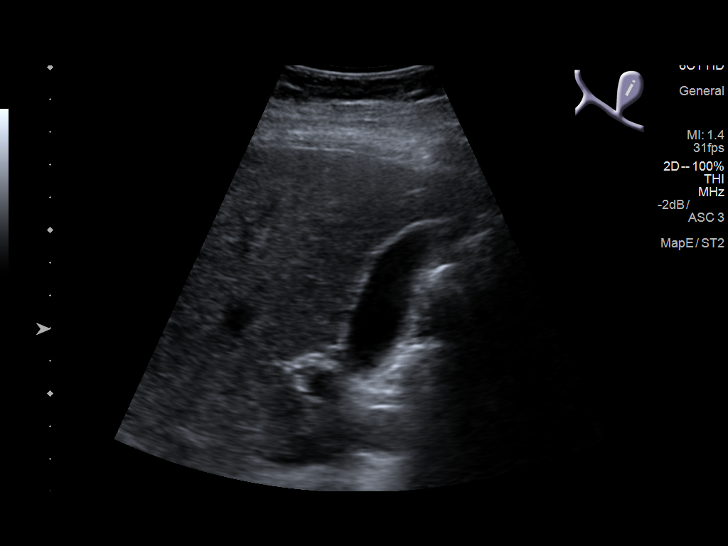
[im 10/29]
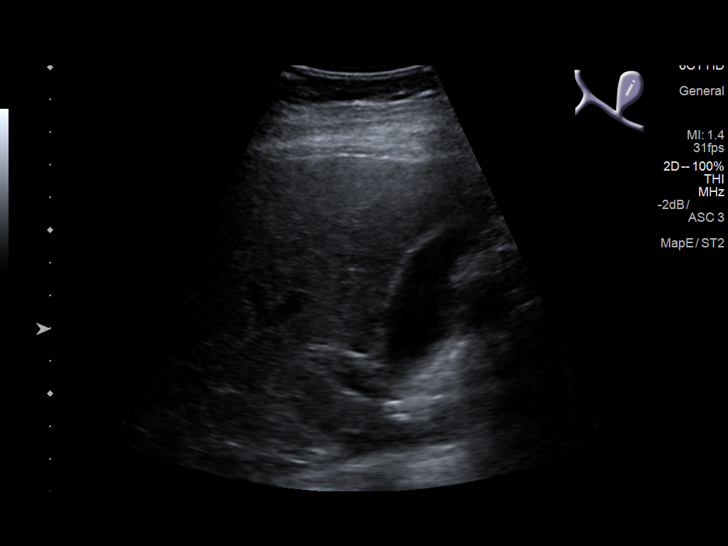
[im 11/29]
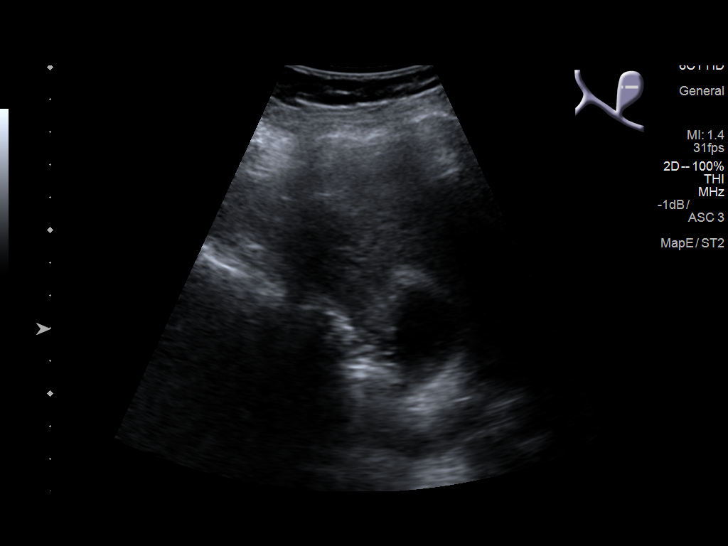
[im 13/29]
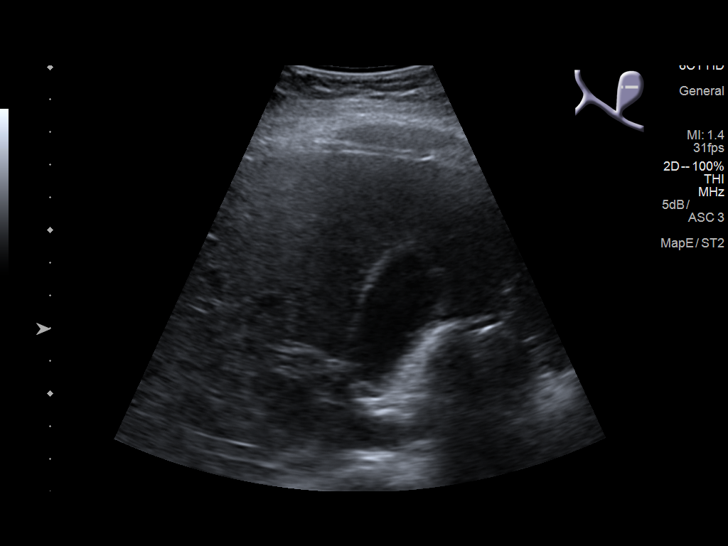
[im 16/29]
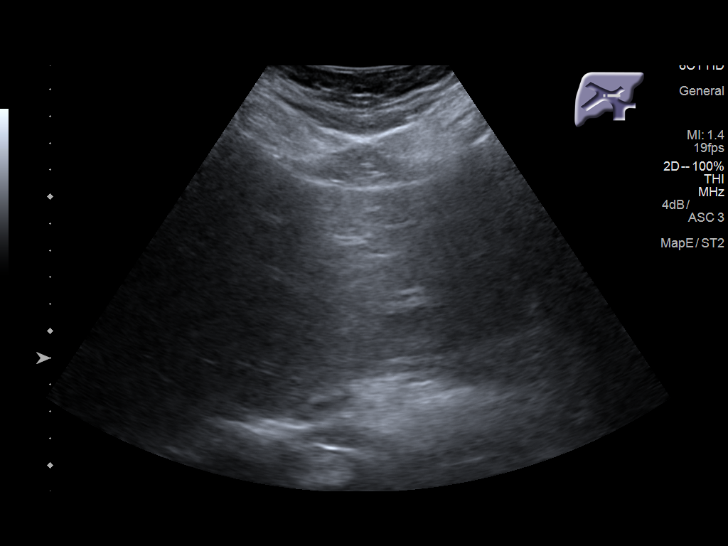
[im 18/29]
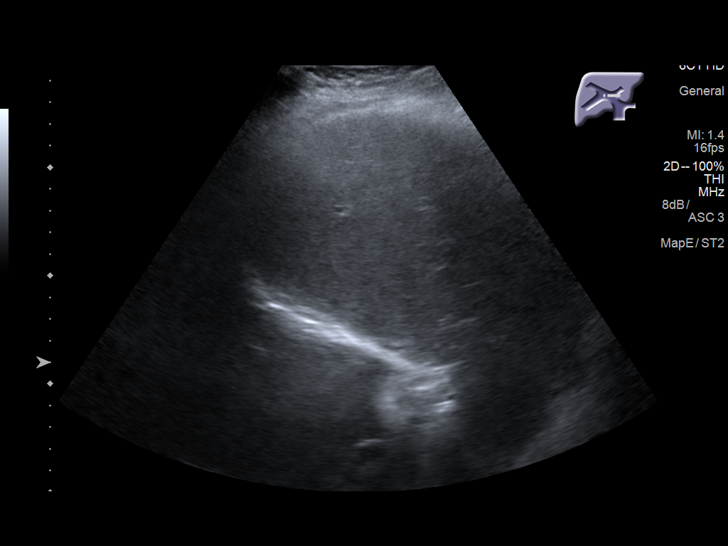
[im 19/29]
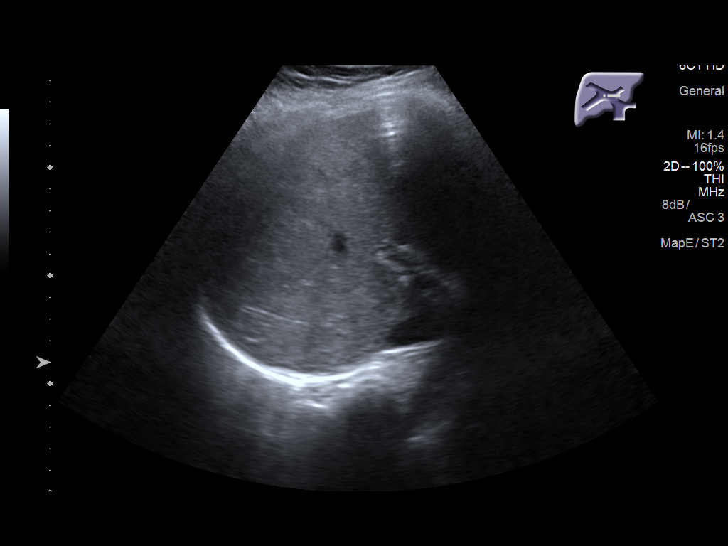
[im 22/29]
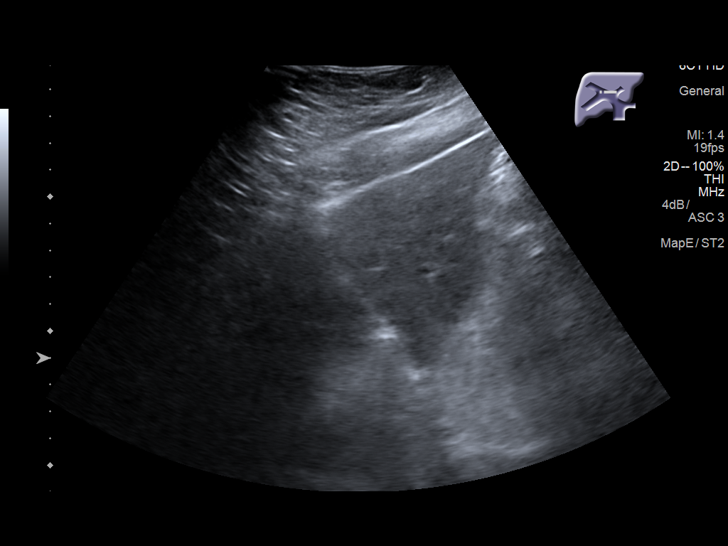
[im 24/29]
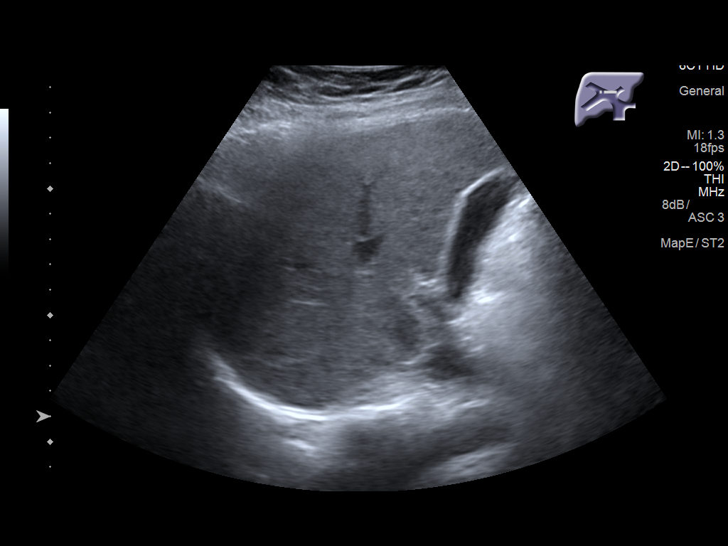
[im 26/29]
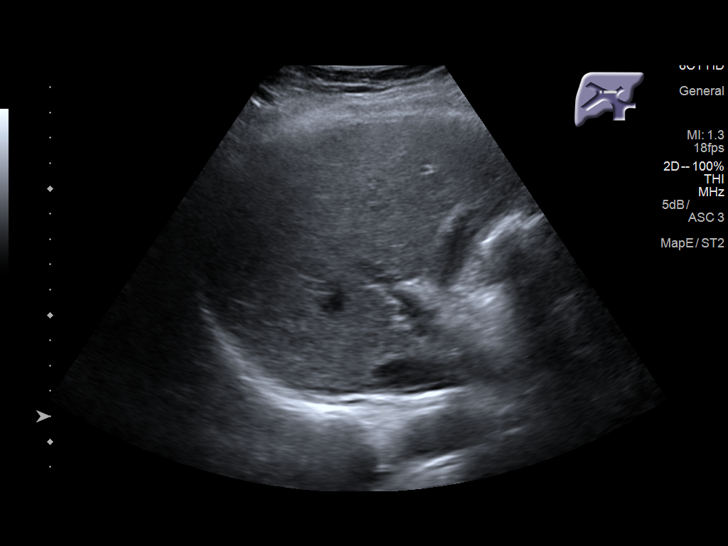
[im 29/29]
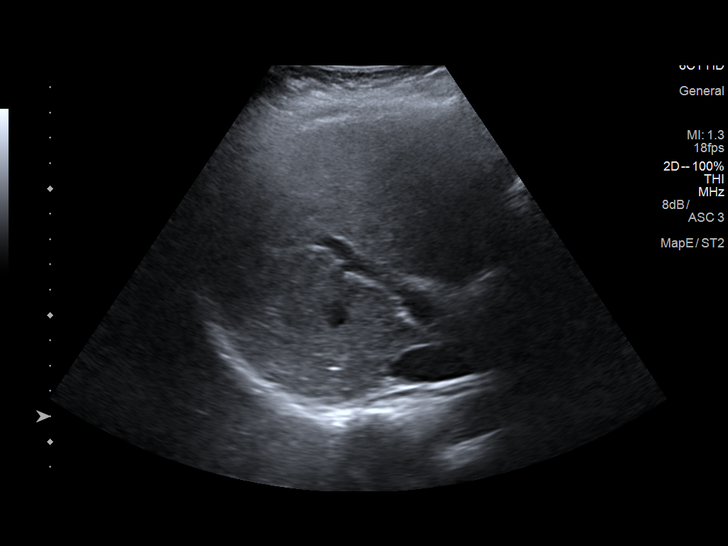

[14 of 25 positions shown; findings below may reference images not displayed]

FINDINGS: Gallbladder:

No gallstones or wall thickening visualized. No sonographic Murphy
sign noted by sonographer.

Common bile duct:

Diameter: 2.8 mm

Liver:

No focal lesion identified. Diffusely increased echogenicity,
suggesting steatosis. Portal vein is patent on color Doppler imaging
with normal direction of blood flow towards the liver.
IMPRESSION: 1. Normal sonographic appearance of the gallbladder with no
cholelithiasis or evidence for acute cholecystitis. No biliary
dilatation.
2. Increased hepatic echogenicity, suggesting steatosis.

## 2020-04-01 ENCOUNTER — Other Ambulatory Visit: Payer: Self-pay

## 2020-04-01 DIAGNOSIS — Z20822 Contact with and (suspected) exposure to covid-19: Secondary | ICD-10-CM

## 2020-04-03 LAB — SARS-COV-2, NAA 2 DAY TAT

## 2020-04-03 LAB — NOVEL CORONAVIRUS, NAA: SARS-CoV-2, NAA: DETECTED — AB

## 2020-04-04 ENCOUNTER — Telehealth (HOSPITAL_COMMUNITY): Payer: Self-pay

## 2020-04-04 NOTE — Telephone Encounter (Signed)
Called to Discuss with patient about Covid symptoms and the use of the monoclonal antibody infusion for those with mild to moderate Covid symptoms and at a high risk of hospitalization.     Pt appears to qualify for this infusion due to co-morbid conditions and/or a member of an at-risk group in accordance with the FDA Emergency Use Authorization.    Unable to reach pt    

## 2020-04-11 ENCOUNTER — Other Ambulatory Visit: Payer: Self-pay

## 2020-04-11 DIAGNOSIS — Z20822 Contact with and (suspected) exposure to covid-19: Secondary | ICD-10-CM

## 2020-04-12 LAB — SARS-COV-2, NAA 2 DAY TAT

## 2020-04-12 LAB — NOVEL CORONAVIRUS, NAA: SARS-CoV-2, NAA: NOT DETECTED

## 2020-11-17 ENCOUNTER — Ambulatory Visit: Payer: Self-pay | Attending: Internal Medicine

## 2020-11-17 DIAGNOSIS — Z20822 Contact with and (suspected) exposure to covid-19: Secondary | ICD-10-CM | POA: Insufficient documentation

## 2020-11-18 LAB — SARS-COV-2, NAA 2 DAY TAT

## 2020-11-18 LAB — NOVEL CORONAVIRUS, NAA: SARS-CoV-2, NAA: NOT DETECTED

## 2021-01-10 ENCOUNTER — Emergency Department (HOSPITAL_COMMUNITY): Admission: EM | Admit: 2021-01-10 | Discharge: 2021-01-10 | Discharge status: Against Medical Advice | Payer: Self-pay

## 2021-01-10 NOTE — ED Notes (Signed)
Pt gave [facility] staff labels and left.

## 2021-08-01 ENCOUNTER — Encounter: Payer: Self-pay | Admitting: Internal Medicine

## 2021-11-20 ENCOUNTER — Ambulatory Visit: Payer: Self-pay | Admitting: Gastroenterology

## 2021-12-27 ENCOUNTER — Ambulatory Visit (INDEPENDENT_AMBULATORY_CARE_PROVIDER_SITE_OTHER): Payer: Self-pay | Admitting: Gastroenterology

## 2021-12-27 ENCOUNTER — Encounter: Payer: Self-pay | Admitting: *Deleted

## 2021-12-27 ENCOUNTER — Encounter: Payer: Self-pay | Admitting: Gastroenterology

## 2021-12-27 DIAGNOSIS — K5900 Constipation, unspecified: Secondary | ICD-10-CM

## 2021-12-27 DIAGNOSIS — K625 Hemorrhage of anus and rectum: Secondary | ICD-10-CM

## 2021-12-27 NOTE — Patient Instructions (Addendum)
Estamos organizando una colonoscopia en un futuro cercano con el Dr. Abbey Chatters.  Podemos verlo en el seguimiento despus de eso para la banda de hemorroides!  Recomiendo comenzar Benefiber 2 cucharaditas cada maana mezcladas con la bebida de su eleccin.  Tambin te he dado muestras de Linzess. Comience a tomar Linzess 1 cpsula 30 minutos antes del desayuno [employer]. Es normal Air traffic controller de materia fecal ms suelta durante los primeros [place], pero esto Pharmacologist. Si no es as, llmenos, ya que necesitaremos ajustar la dosis. Djame saber cmo funciona esto para ti!  Fue un Oceanographer. Arelia Sneddon crear relaciones de confianza con los pacientes para brindar atencin [place], [place] y de calidad. Valoro tus comentarios. Si recibe [facility] su visita, le agradezco mucho que se tome el tiempo para completarla.  Dra. Annitta Needs, ANP-BC [facility] [place]       We are arranging a colonoscopy in the near future with Dr. Abbey Chatters.   We can see you in follow-up after that for hemorrhoid banding!  I recommend starting Benefiber 2 teaspoons each morning mixed in beverage of your choice.  I have also given you samples of Linzess. Start taking Linzess 1 capsule 30 minutes before breakfast daily. It is normal to have some looser stool starting out for the first few days, but this should improve. If it does not, please call us, as we will need to adjust the dosage. Let me know how this works for you!  It was a pleasure to see you today. I want to create trusting relationships with patients to provide genuine, compassionate, and quality care. I value your feedback. If you receive a survey regarding your visit,  I greatly appreciate you taking time to fill this out.   Annitta Needs, PhD, ANP-BC [facility]

## 2021-12-27 NOTE — H&P (View-Only) (Signed)
Gastroenterology Office Note    Referring Provider: Raiford Simmonds., PA-C Primary Care Physician:  Raiford Simmonds., PA-C  Primary GI: Dr. Abbey Chatters   Chief Complaint   Chief Complaint  Patient presents with   New Patient (Initial Visit)    Constipation and rectal bleeding     History of Present Illness   Nicole Le is a 36 y.o. female presenting today at the request of Muse, Rochelle D., PA-C due to constipation, rectal bleeding, and heme positive stool. Interpreter present with her today.   A year ago saw small amount of rectal bleeding with BMs, then every few months. However, 3 weeks ago had one week of bleeding daily. 3 weeks ago had diarrhea the whole week and had rectal bleeding with this. No rectal pain. No itching or burning. Prolapsing tissue.   Chronic constipation. BM sometimes daily but sometimes every few days. Straining associated. No abdominal pain. Has tried increasing dietary fiber, probiotics, green smoothies, salads. Takes magnesium intermittently. An OTC fiber caused cramping but can't remember the name.   No FH of colon cancer or colon polyps.        Past Medical History:  Diagnosis Date   Medical history non-contributory    No pertinent past medical history     Past Surgical History:  Procedure Laterality Date   DILATION AND CURETTAGE OF UTERUS  02/13/2012   Procedure: DILATATION AND CURETTAGE;  Surgeon: Jonnie Kind, MD;  Location: [facility];  Service: Gynecology;  Laterality: N/A;  Suction     Current Outpatient Medications  Medication Sig Dispense Refill   acetaminophen (TYLENOL) 325 MG tablet Take 2 tablets (650 mg total) by mouth every 4 (four) hours as needed (for pain scale < 4). 30 tablet 0   No current facility-administered medications for this visit.    Allergies as of 12/27/2021   (No Known Allergies)    Family History  Problem Relation Age of Onset   Hypertension Mother    Diabetes Mother    Hypertension  Sister    Diabetes Brother    Other Daughter        heart murmur; has 2 holes in heart   Hypertension Maternal Uncle    Hypertension Paternal Uncle    Colon cancer Neg Hx    Colon polyps Neg Hx     Social History   Socioeconomic History   Marital status: Married    Spouse name: Not on file   Number of children: Not on file   Years of education: Not on file   Highest education level: Not on file  Occupational History   Not on file  Tobacco Use   Smoking status: Never   Smokeless tobacco: Never  Vaping Use   Vaping Use: Never used  Substance and Sexual Activity   Alcohol use: Yes    Comment: social   Drug use: No   Sexual activity: Not Currently    Birth control/protection: None  Other Topics Concern   Not on file  Social History Narrative   Not on file   Social Determinants of Health   Financial Resource Strain: Not on file  Food Insecurity: Not on file  Transportation Needs: Not on file  Physical Activity: Not on file  Stress: Not on file  Social Connections: Not on file  Intimate Partner Violence: Not on file     Review of Systems   Gen: Denies any fever, chills, fatigue, weight loss, lack of appetite.  CV: Denies  chest pain, heart palpitations, peripheral edema, syncope.  Resp: Denies shortness of breath at rest or with exertion. Denies wheezing or cough.  GI: see HPI GU : Denies urinary burning, urinary frequency, urinary hesitancy MS: Denies joint pain, muscle weakness, cramps, or limitation of movement.  Derm: Denies rash, itching, dry skin Psych: Denies depression, anxiety, memory loss, and confusion Heme: see HPI   Physical Exam   BP 116/80   Pulse 73   Temp (!) 97.3 F (36.3 C)   Ht '5\' 1"'$  (1.549 m)   Wt 185 lb 12.8 oz (84.3 kg)   LMP 12/06/2021   BMI 35.11 kg/m  General:   Alert and oriented. Pleasant and cooperative. Well-nourished and well-developed.  Head:  Normocephalic and atraumatic. Eyes:  Without icterus Ears:  Normal auditory  acuity. Lungs:  Clear to auscultation bilaterally.  Heart:  S1, S2 present without murmurs appreciated.  Abdomen:  +BS, soft, non-tender and non-distended. No HSM noted. No guarding or rebound. No masses appreciated.  Rectal:  small hemorrhoid tags. DRE without mass. Prominence of hemorrhoid columns left laterally and right posteriorly Msk:  Symmetrical without gross deformities. Normal posture. Extremities:  Without edema. Neurologic:  Alert and  oriented x4;  grossly normal neurologically. Skin:  Intact without significant lesions or rashes. Psych:  Alert and cooperative. Normal mood and affect.   Assessment   Nicole Le is a 36 y.o. female presenting today at the request of [facility], PA-C, due to constipation, rectal bleeding, and heme positive stool. Interpreter present today.   Constipation: some benefit with supplemental fiber and magnesium. Will start Benefiber 2 teaspoons daily and have her trial Linzess 145 mcg daily. She is to call with update. Will need to find best dose and then pursue patient assistance.   Rectal bleeding/heme positive stool: in setting of constipation. DRE with likely internal hemorrhoids. No FH colon cancer or polyps. Will pursue diagnostic colonoscopy in the near future. She would be an excellent banding candidate as well. We discussed this in detail today.    PLAN   Proceed with colonoscopy by Dr. Abbey Chatters  in near future: the risks, benefits, and alternatives have been discussed with the patient in detail. The patient states understanding and desires to proceed.   Benefiber 2 teaspoons daily, Linzess 145 mcg daily. Call with update  Banding thereafter  Annitta Needs, PhD, ANP-BC [facility]

## 2021-12-27 NOTE — Progress Notes (Signed)
Gastroenterology Office Note    Referring Provider: Raiford Simmonds., PA-C Primary Care Physician:  Raiford Simmonds., PA-C  Primary GI: Dr. Abbey Chatters   Chief Complaint   Chief Complaint  Patient presents with   New Patient (Initial Visit)    Constipation and rectal bleeding     History of Present Illness   Nicole Le is a 36 y.o. female presenting today at the request of Muse, Rochelle D., PA-C due to constipation, rectal bleeding, and heme positive stool. Interpreter present with her today.   A year ago saw small amount of rectal bleeding with BMs, then every few months. However, 3 weeks ago had one week of bleeding daily. 3 weeks ago had diarrhea the whole week and had rectal bleeding with this. No rectal pain. No itching or burning. Prolapsing tissue.   Chronic constipation. BM sometimes daily but sometimes every few days. Straining associated. No abdominal pain. Has tried increasing dietary fiber, probiotics, green smoothies, salads. Takes magnesium intermittently. An OTC fiber caused cramping but can't remember the name.   No FH of colon cancer or colon polyps.        Past Medical History:  Diagnosis Date   Medical history non-contributory    No pertinent past medical history     Past Surgical History:  Procedure Laterality Date   DILATION AND CURETTAGE OF UTERUS  02/13/2012   Procedure: DILATATION AND CURETTAGE;  Surgeon: Jonnie Kind, MD;  Location: [facility];  Service: Gynecology;  Laterality: N/A;  Suction     Current Outpatient Medications  Medication Sig Dispense Refill   acetaminophen (TYLENOL) 325 MG tablet Take 2 tablets (650 mg total) by mouth every 4 (four) hours as needed (for pain scale < 4). 30 tablet 0   No current facility-administered medications for this visit.    Allergies as of 12/27/2021   (No Known Allergies)    Family History  Problem Relation Age of Onset   Hypertension Mother    Diabetes Mother    Hypertension  Sister    Diabetes Brother    Other Daughter        heart murmur; has 2 holes in heart   Hypertension Maternal Uncle    Hypertension Paternal Uncle    Colon cancer Neg Hx    Colon polyps Neg Hx     Social History   Socioeconomic History   Marital status: Married    Spouse name: Not on file   Number of children: Not on file   Years of education: Not on file   Highest education level: Not on file  Occupational History   Not on file  Tobacco Use   Smoking status: Never   Smokeless tobacco: Never  Vaping Use   Vaping Use: Never used  Substance and Sexual Activity   Alcohol use: Yes    Comment: social   Drug use: No   Sexual activity: Not Currently    Birth control/protection: None  Other Topics Concern   Not on file  Social History Narrative   Not on file   Social Determinants of Health   Financial Resource Strain: Not on file  Food Insecurity: Not on file  Transportation Needs: Not on file  Physical Activity: Not on file  Stress: Not on file  Social Connections: Not on file  Intimate Partner Violence: Not on file     Review of Systems   Gen: Denies any fever, chills, fatigue, weight loss, lack of appetite.  CV: Denies  chest pain, heart palpitations, peripheral edema, syncope.  Resp: Denies shortness of breath at rest or with exertion. Denies wheezing or cough.  GI: see HPI GU : Denies urinary burning, urinary frequency, urinary hesitancy MS: Denies joint pain, muscle weakness, cramps, or limitation of movement.  Derm: Denies rash, itching, dry skin Psych: Denies depression, anxiety, memory loss, and confusion Heme: see HPI   Physical Exam   BP 116/80   Pulse 73   Temp (!) 97.3 F (36.3 C)   Ht '5\' 1"'$  (1.549 m)   Wt 185 lb 12.8 oz (84.3 kg)   LMP 12/06/2021   BMI 35.11 kg/m  General:   Alert and oriented. Pleasant and cooperative. Well-nourished and well-developed.  Head:  Normocephalic and atraumatic. Eyes:  Without icterus Ears:  Normal auditory  acuity. Lungs:  Clear to auscultation bilaterally.  Heart:  S1, S2 present without murmurs appreciated.  Abdomen:  +BS, soft, non-tender and non-distended. No HSM noted. No guarding or rebound. No masses appreciated.  Rectal:  small hemorrhoid tags. DRE without mass. Prominence of hemorrhoid columns left laterally and right posteriorly Msk:  Symmetrical without gross deformities. Normal posture. Extremities:  Without edema. Neurologic:  Alert and  oriented x4;  grossly normal neurologically. Skin:  Intact without significant lesions or rashes. Psych:  Alert and cooperative. Normal mood and affect.   Assessment   Nicole Le is a 36 y.o. female presenting today at the request of [facility], PA-C, due to constipation, rectal bleeding, and heme positive stool. Interpreter present today.   Constipation: some benefit with supplemental fiber and magnesium. Will start Benefiber 2 teaspoons daily and have her trial Linzess 145 mcg daily. She is to call with update. Will need to find best dose and then pursue patient assistance.   Rectal bleeding/heme positive stool: in setting of constipation. DRE with likely internal hemorrhoids. No FH colon cancer or polyps. Will pursue diagnostic colonoscopy in the near future. She would be an excellent banding candidate as well. We discussed this in detail today.    PLAN   Proceed with colonoscopy by Dr. Abbey Chatters  in near future: the risks, benefits, and alternatives have been discussed with the patient in detail. The patient states understanding and desires to proceed.   Benefiber 2 teaspoons daily, Linzess 145 mcg daily. Call with update  Banding thereafter  Annitta Needs, PhD, ANP-BC [facility]

## 2022-01-02 ENCOUNTER — Other Ambulatory Visit (HOSPITAL_COMMUNITY)
Admission: RE | Admit: 2022-01-02 | Discharge: 2022-01-02 | Disposition: A | Discharge status: Home / Self Care | Payer: Self-pay | Source: Ambulatory Visit | Attending: Internal Medicine | Admitting: Internal Medicine

## 2022-01-02 DIAGNOSIS — K625 Hemorrhage of anus and rectum: Secondary | ICD-10-CM | POA: Insufficient documentation

## 2022-01-02 LAB — PREGNANCY, URINE: Preg Test, Ur: NEGATIVE

## 2022-01-04 ENCOUNTER — Ambulatory Visit (HOSPITAL_COMMUNITY)
Admission: RE | Admit: 2022-01-04 | Discharge: 2022-01-04 | Disposition: A | Discharge status: Home / Self Care | Payer: Self-pay | Attending: Internal Medicine | Admitting: Internal Medicine

## 2022-01-04 ENCOUNTER — Encounter (HOSPITAL_COMMUNITY): Payer: Self-pay | Admitting: *Deleted

## 2022-01-04 ENCOUNTER — Encounter (HOSPITAL_COMMUNITY)
Admission: RE | Disposition: A | Discharge status: Home / Self Care | Payer: Self-pay | Source: Home / Self Care | Attending: Internal Medicine

## 2022-01-04 ENCOUNTER — Ambulatory Visit (HOSPITAL_COMMUNITY): Payer: Self-pay | Admitting: Certified Registered"

## 2022-01-04 ENCOUNTER — Ambulatory Visit (HOSPITAL_BASED_OUTPATIENT_CLINIC_OR_DEPARTMENT_OTHER): Payer: Self-pay | Admitting: Certified Registered"

## 2022-01-04 ENCOUNTER — Other Ambulatory Visit: Payer: Self-pay

## 2022-01-04 DIAGNOSIS — R195 Other fecal abnormalities: Secondary | ICD-10-CM | POA: Insufficient documentation

## 2022-01-04 DIAGNOSIS — E669 Obesity, unspecified: Secondary | ICD-10-CM | POA: Insufficient documentation

## 2022-01-04 DIAGNOSIS — K648 Other hemorrhoids: Secondary | ICD-10-CM | POA: Insufficient documentation

## 2022-01-04 DIAGNOSIS — K625 Hemorrhage of anus and rectum: Secondary | ICD-10-CM | POA: Insufficient documentation

## 2022-01-04 DIAGNOSIS — D122 Benign neoplasm of ascending colon: Secondary | ICD-10-CM | POA: Insufficient documentation

## 2022-01-04 DIAGNOSIS — Z6835 Body mass index (BMI) 35.0-35.9, adult: Secondary | ICD-10-CM | POA: Insufficient documentation

## 2022-01-04 DIAGNOSIS — K635 Polyp of colon: Secondary | ICD-10-CM

## 2022-01-04 DIAGNOSIS — K5909 Other constipation: Secondary | ICD-10-CM | POA: Insufficient documentation

## 2022-01-04 HISTORY — DX: Palpitations: R00.2

## 2022-01-04 HISTORY — PX: POLYPECTOMY: SHX5525

## 2022-01-04 HISTORY — PX: COLONOSCOPY WITH PROPOFOL: SHX5780

## 2022-01-04 SURGERY — COLONOSCOPY WITH PROPOFOL
Anesthesia: General

## 2022-01-04 MED ORDER — PROPOFOL 10 MG/ML IV BOLUS
INTRAVENOUS | Status: DC | PRN
  Administered 2022-01-04 (×2): 50 mg via INTRAVENOUS
  Administered 2022-01-04: 150 mg via INTRAVENOUS

## 2022-01-04 MED ORDER — LACTATED RINGERS IV SOLN: INTRAVENOUS | Status: DC | PRN

## 2022-01-04 MED ORDER — LIDOCAINE HCL (CARDIAC) PF 100 MG/5ML IV SOSY
PREFILLED_SYRINGE | INTRAVENOUS | Status: DC | PRN
  Administered 2022-01-04: 50 mg via INTRAVENOUS

## 2022-01-04 MED ORDER — LACTATED RINGERS IV SOLN: INTRAVENOUS | Status: DC

## 2022-01-04 NOTE — Anesthesia Preprocedure Evaluation (Addendum)
Anesthesia Evaluation  Patient identified by MRN, date of birth, ID band Patient awake    Reviewed: Allergy & Precautions, H&P , NPO status , Patient's Chart, lab work & pertinent test results  Airway Mallampati: II  TM Distance: >3 FB Neck ROM: Full    Dental  (+) Teeth Intact   Pulmonary neg pulmonary ROS,    breath sounds clear to auscultation       Cardiovascular Exercise Tolerance: Good METS: 3 - Mets + dysrhythmias (palpitations at night)  Rhythm:Regular Rate:Normal     Neuro/Psych negative neurological ROS     GI/Hepatic negative GI ROS, Neg liver ROS,   Endo/Other  negative endocrine ROS  Renal/GU negative Renal ROS     Musculoskeletal negative musculoskeletal ROS (+)   Abdominal (+) + obese,   Peds  Hematology negative hematology ROS (+)   Anesthesia Other Findings   Reproductive/Obstetrics                            Anesthesia Physical  Anesthesia Plan  ASA: 2  Anesthesia Plan: General   Post-op Pain Management:    Induction: Intravenous  PONV Risk Score and Plan: 2 and Propofol infusion and TIVA  Airway Management Planned: Natural Airway and Nasal Cannula  Additional Equipment:   Intra-op Plan:   Post-operative Plan:   Informed Consent: I have reviewed the patients History and Physical, chart, labs and discussed the procedure including the risks, benefits and alternatives for the proposed anesthesia with the patient or authorized representative who has indicated his/her understanding and acceptance.       Plan Discussed with: CRNA  Anesthesia Plan Comments:         Anesthesia Quick Evaluation

## 2022-01-04 NOTE — Progress Notes (Signed)
Interpreter-Juli [phone]) used for pre-op nursing assessment.

## 2022-01-04 NOTE — Discharge Instructions (Addendum)
Su colonoscopia fue normal en general.  Usted tiene hemorroides internas que es probablemente la causa de su sangrado.  Podemos prepararlo para el tratamiento de las hemorroides en nuestra clnica si lo desea.    Le quit 1 plipo pequeo de su colon.  Esto es probablemente benigno.  Te llamaremos con resultados.  Repetir la colonoscopia en 10 aos.

## 2022-01-04 NOTE — Interval H&P Note (Signed)
History and Physical Interval Note:  01/04/2022 1:03 PM  Nicole Le  has presented today for surgery, with the diagnosis of rectal bleeding.  The various methods of treatment have been discussed with the patient and family. After consideration of risks, benefits and other options for treatment, the patient has consented to  Procedure(s) with comments: COLONOSCOPY WITH PROPOFOL (N/A) - 2:30pm as a surgical intervention.  The patient's history has been reviewed, patient examined, no change in status, stable for surgery.  I have reviewed the patient's chart and labs.  Questions were answered to the patient's satisfaction.     Eloise Harman

## 2022-01-04 NOTE — Anesthesia Procedure Notes (Signed)
Date/Time: 01/04/2022 1:28 PM  Performed by: Orlie Dakin, CRNAPre-anesthesia Checklist: Patient identified, Emergency Drugs available, Suction available and Patient being monitored Patient Re-evaluated:Patient Re-evaluated prior to induction Oxygen Delivery Method: Nasal cannula Induction Type: IV induction Placement Confirmation: positive ETCO2

## 2022-01-04 NOTE — Anesthesia Postprocedure Evaluation (Signed)
Anesthesia Post Note  Patient: Savonna Birchmeier  Procedure(s) Performed: COLONOSCOPY WITH PROPOFOL POLYPECTOMY  Patient location during evaluation: Phase II Anesthesia Type: General Level of consciousness: awake and alert Pain management: pain level controlled Vital Signs Assessment: post-procedure vital signs reviewed and stable Respiratory status: spontaneous breathing, nonlabored ventilation, respiratory function stable and patient connected to nasal cannula oxygen Cardiovascular status: blood pressure returned to baseline and stable Postop Assessment: no apparent nausea or vomiting Anesthetic complications: no   There were no known notable events for this encounter.   Last Vitals:  Vitals:   01/04/22 1243 01/04/22 1343  BP: 116/69 (!) 92/55  Pulse: 69   Resp: 12 16  Temp: 36.5 C 36.6 C  SpO2: 99% 99%    Last Pain:  Vitals:   01/04/22 1343  TempSrc: Oral  PainSc: 0-No pain                 Trixie Rude

## 2022-01-04 NOTE — Op Note (Signed)
[facility] Patient Name: Nicole Le Procedure Date: 01/04/2022 1:05 PM MRN: [id number] Date of Birth: June 13, 1986 Attending MD: Elon Alas. Abbey Chatters DO CSN: [id number] Age: 36 Admit Type: Outpatient Procedure:                Colonoscopy Indications:              Rectal bleeding Providers:                Elon Alas. Abbey Chatters, DO, Lambert Mody, Dereck Leep, Technician Referring MD:              Medicines:                See the Anesthesia note for documentation of the                            administered medications Complications:            No immediate complications. Estimated Blood Loss:     Estimated blood loss was minimal. Procedure:                Pre-Anesthesia Assessment:                           - The anesthesia plan was to use monitored                            anesthesia care (MAC).                           After obtaining informed consent, the colonoscope                            was passed under direct vision. Throughout the                            procedure, the patient's blood pressure, pulse, and                            oxygen saturations were monitored continuously. The                            [id number]-HQ190L ([id number]) scope was introduced through                            the anus and advanced to the the terminal ileum,                            with identification of the appendiceal orifice and                            IC valve. The colonoscopy was performed without                            difficulty. The patient tolerated the procedure  well. The quality of the bowel preparation was                            evaluated using the BBPS [facility] Bowel Preparation                            Scale) with scores of: Right Colon = 3, Transverse                            Colon = 3 and Left Colon = 3 (entire mucosa seen                            well with no residual  staining, small fragments of                            stool or opaque liquid). The total BBPS score                            equals 9. Scope In: 1:27:34 PM Scope Out: 1:39:35 PM Scope Withdrawal Time: 0 hours 9 minutes 9 seconds  Total Procedure Duration: 0 hours 12 minutes 1 second  Findings:      The perianal and digital rectal examinations were normal.      Non-bleeding internal hemorrhoids were found during retroflexion.      A 3 mm polyp was found in the ascending colon. The polyp was sessile.       The polyp was removed with a cold snare. Resection and retrieval were       complete.      The terminal ileum appeared normal.      The exam was otherwise without abnormality. Impression:               - Non-bleeding internal hemorrhoids.                           - One 3 mm polyp in the ascending colon, removed                            with a cold snare. Resected and retrieved.                           - The examined portion of the ileum was normal.                           - The examination was otherwise normal. Moderate Sedation:      Per Anesthesia Care Recommendation:           - Patient has a contact number available for                            emergencies. The signs and symptoms of potential                            delayed complications were discussed with the  patient. Return to normal activities tomorrow.                            Written discharge instructions were provided to the                            patient.                           - Resume previous diet.                           - Continue present medications.                           - Await pathology results.                           - Repeat colonoscopy in 10 years for screening                            purposes.                           - Return to GI clinic PRN with Roseanne Kaufman for                            hemorrhoid banding if bleeding continues. Procedure  Code(s):        --- Professional ---                           [phone], Colonoscopy, flexible; with removal of                            tumor(s), polyp(s), or other lesion(s) by snare                            technique Diagnosis Code(s):        --- Professional ---                           K63.5, Polyp of colon                           K64.8, Other hemorrhoids                           K62.5, Hemorrhage of anus and rectum CPT copyright 2019 [employer]. All rights reserved. The codes documented in this report are preliminary and upon coder review may  be revised to meet current compliance requirements. Elon Alas. Abbey Chatters, DO [place] Abbey Chatters, DO 01/04/2022 1:42:32 PM This report has been signed electronically. Number of Addenda: 0

## 2022-01-04 NOTE — Transfer of Care (Signed)
Immediate Anesthesia Transfer of Care Note  Patient: Nicole Le  Procedure(s) Performed: COLONOSCOPY WITH PROPOFOL POLYPECTOMY  Patient Location: Endoscopy Unit  Anesthesia Type:General  Level of Consciousness: drowsy  Airway & Oxygen Therapy: Patient Spontanous Breathing  Post-op Assessment: Report given to RN and Post -op Vital signs reviewed and stable  Post vital signs: Reviewed and stable  Last Vitals:  Vitals Value Taken Time  BP    Temp    Pulse    Resp    SpO2      Last Pain:  Vitals:   01/04/22 1323  TempSrc:   PainSc: 0-No pain      Patients Stated Pain Goal: 6 (61/68/37 2902)  Complications: No notable events documented.

## 2022-01-08 LAB — SURGICAL PATHOLOGY

## 2022-01-10 ENCOUNTER — Encounter (HOSPITAL_COMMUNITY): Payer: Self-pay | Admitting: Internal Medicine

## 2022-01-17 ENCOUNTER — Encounter: Payer: Self-pay | Admitting: Gastroenterology

## 2022-01-24 NOTE — Progress Notes (Signed)
  August 3,2023     [place] [id number]     Dear Omar Person,              The polyp(s) removed during your colonoscopy were tubular adenoma(s). We will need to repeat your next colonoscopy in 5 years as discussed postoperatively.       Follow-up with GI as needed. If you have any questions/concerns please call the office @ [phone] and speak with a nurse.       Thank you, Oleh Genin, CMA

## 2022-02-21 ENCOUNTER — Encounter: Payer: Self-pay | Admitting: Gastroenterology

## 2023-01-14 ENCOUNTER — Other Ambulatory Visit: Payer: Self-pay | Admitting: Family Medicine

## 2023-01-14 DIAGNOSIS — M25512 Pain in left shoulder: Secondary | ICD-10-CM

## 2023-01-31 ENCOUNTER — Inpatient Hospital Stay: Admission: RE | Admit: 2023-01-31 | Payer: Self-pay | Source: Ambulatory Visit

## 2023-09-02 ENCOUNTER — Telehealth: Payer: Self-pay

## 2023-09-02 NOTE — Telephone Encounter (Signed)
 Attempt wellness/follow up call to [facility] with interepter services. NO answer, also sent text message in Spanish to client if she has questions about any services of [facility] to please contact our office. Listed [facility] Main line as well as bilingual care guide Maricarmen Garduno.  Health Department : she was last seen 03/19/23 and has no future appointments pending.   Francee Nodal RN [employer]

## 2024-03-24 ENCOUNTER — Other Ambulatory Visit: Payer: Self-pay | Admitting: Nurse Practitioner

## 2024-03-24 DIAGNOSIS — Z1231 Encounter for screening mammogram for malignant neoplasm of breast: Secondary | ICD-10-CM

## 2024-04-23 ENCOUNTER — Ambulatory Visit (HOSPITAL_COMMUNITY): Payer: Self-pay

## 2024-05-07 ENCOUNTER — Ambulatory Visit (HOSPITAL_COMMUNITY)
Admission: RE | Admit: 2024-05-07 | Discharge: 2024-05-07 | Disposition: A | Discharge status: Home / Self Care | Payer: Self-pay | Source: Ambulatory Visit | Attending: Nurse Practitioner | Admitting: Nurse Practitioner

## 2024-05-07 DIAGNOSIS — Z1231 Encounter for screening mammogram for malignant neoplasm of breast: Secondary | ICD-10-CM | POA: Insufficient documentation
